# Patient Record
Sex: Female | Born: 1970
Health system: Southern US, Community
[De-identification: ages and names within clinical notes are randomized; demographics above are authoritative.]

## PROBLEM LIST (undated history)

## (undated) DIAGNOSIS — T7840XA Allergy, unspecified, initial encounter: Secondary | ICD-10-CM

## (undated) DIAGNOSIS — Z9049 Acquired absence of other specified parts of digestive tract: Secondary | ICD-10-CM

## (undated) DIAGNOSIS — E785 Hyperlipidemia, unspecified: Secondary | ICD-10-CM

## (undated) DIAGNOSIS — K219 Gastro-esophageal reflux disease without esophagitis: Secondary | ICD-10-CM

## (undated) DIAGNOSIS — F32A Depression, unspecified: Secondary | ICD-10-CM

## (undated) DIAGNOSIS — I10 Essential (primary) hypertension: Secondary | ICD-10-CM

## (undated) DIAGNOSIS — F419 Anxiety disorder, unspecified: Secondary | ICD-10-CM

## (undated) DIAGNOSIS — M1990 Unspecified osteoarthritis, unspecified site: Secondary | ICD-10-CM

## (undated) DIAGNOSIS — K589 Irritable bowel syndrome without diarrhea: Secondary | ICD-10-CM

## (undated) DIAGNOSIS — D649 Anemia, unspecified: Secondary | ICD-10-CM

## (undated) DIAGNOSIS — F329 Major depressive disorder, single episode, unspecified: Secondary | ICD-10-CM

## (undated) HISTORY — DX: Essential (primary) hypertension: I10

## (undated) HISTORY — PX: APPENDECTOMY: SHX54

## (undated) HISTORY — DX: Anemia, unspecified: D64.9

## (undated) HISTORY — PX: COLONOSCOPY: SHX174

## (undated) HISTORY — PX: FRACTURE SURGERY: SHX138

## (undated) HISTORY — DX: Anxiety disorder, unspecified: F41.9

## (undated) HISTORY — DX: Allergy, unspecified, initial encounter: T78.40XA

## (undated) HISTORY — DX: Hyperlipidemia, unspecified: E78.5

## (undated) HISTORY — DX: Unspecified osteoarthritis, unspecified site: M19.90

## (undated) HISTORY — DX: Depression, unspecified: F32.A

## (undated) HISTORY — DX: Major depressive disorder, single episode, unspecified: F32.9

## (undated) HISTORY — DX: Irritable bowel syndrome, unspecified: K58.9

## (undated) HISTORY — DX: Gastro-esophageal reflux disease without esophagitis: K21.9

---

## 1998-07-10 HISTORY — PX: FRACTURE SURGERY: SHX138

## 2000-02-26 ENCOUNTER — Inpatient Hospital Stay (HOSPITAL_COMMUNITY): Admission: EM | Admit: 2000-02-26 | Discharge: 2000-02-29 | Payer: Self-pay | Admitting: *Deleted

## 2001-01-28 ENCOUNTER — Encounter: Payer: Self-pay | Admitting: Oral Surgery

## 2001-01-29 ENCOUNTER — Ambulatory Visit (HOSPITAL_COMMUNITY): Admission: RE | Admit: 2001-01-29 | Discharge: 2001-01-29 | Payer: Self-pay | Admitting: Oral Surgery

## 2004-05-17 ENCOUNTER — Emergency Department (HOSPITAL_COMMUNITY): Admission: EM | Admit: 2004-05-17 | Discharge: 2004-05-17 | Payer: Self-pay | Admitting: Emergency Medicine

## 2004-05-20 ENCOUNTER — Ambulatory Visit: Payer: Self-pay | Admitting: Family Medicine

## 2005-01-17 ENCOUNTER — Ambulatory Visit: Payer: Self-pay | Admitting: Family Medicine

## 2005-10-23 ENCOUNTER — Ambulatory Visit: Payer: Self-pay | Admitting: Family Medicine

## 2006-01-25 ENCOUNTER — Ambulatory Visit: Payer: Self-pay | Admitting: Family Medicine

## 2006-10-17 ENCOUNTER — Ambulatory Visit: Payer: Self-pay | Admitting: Family Medicine

## 2006-11-19 ENCOUNTER — Ambulatory Visit: Payer: Self-pay | Admitting: Family Medicine

## 2007-01-21 ENCOUNTER — Ambulatory Visit: Payer: Self-pay | Admitting: Family Medicine

## 2007-02-05 ENCOUNTER — Ambulatory Visit: Payer: Self-pay | Admitting: Family Medicine

## 2007-02-05 DIAGNOSIS — J029 Acute pharyngitis, unspecified: Secondary | ICD-10-CM | POA: Insufficient documentation

## 2007-02-05 DIAGNOSIS — F329 Major depressive disorder, single episode, unspecified: Secondary | ICD-10-CM

## 2007-02-05 DIAGNOSIS — K589 Irritable bowel syndrome without diarrhea: Secondary | ICD-10-CM | POA: Insufficient documentation

## 2007-02-05 DIAGNOSIS — J309 Allergic rhinitis, unspecified: Secondary | ICD-10-CM | POA: Insufficient documentation

## 2007-02-05 LAB — CONVERTED CEMR LAB: Rapid Strep: POSITIVE

## 2007-02-25 ENCOUNTER — Ambulatory Visit: Payer: Self-pay | Admitting: Family Medicine

## 2007-02-25 ENCOUNTER — Telehealth: Payer: Self-pay | Admitting: Family Medicine

## 2007-02-25 LAB — CONVERTED CEMR LAB: Rapid Strep: POSITIVE

## 2008-05-25 ENCOUNTER — Emergency Department (HOSPITAL_COMMUNITY): Admission: EM | Admit: 2008-05-25 | Discharge: 2008-05-25 | Payer: Self-pay | Admitting: Emergency Medicine

## 2008-05-30 ENCOUNTER — Emergency Department (HOSPITAL_COMMUNITY): Admission: EM | Admit: 2008-05-30 | Discharge: 2008-05-30 | Payer: Self-pay | Admitting: Emergency Medicine

## 2009-05-04 ENCOUNTER — Ambulatory Visit (HOSPITAL_COMMUNITY): Admission: RE | Admit: 2009-05-04 | Discharge: 2009-05-04 | Payer: Self-pay | Admitting: Unknown Physician Specialty

## 2009-10-14 ENCOUNTER — Ambulatory Visit: Payer: Self-pay | Admitting: Family Medicine

## 2009-10-14 DIAGNOSIS — I10 Essential (primary) hypertension: Secondary | ICD-10-CM | POA: Insufficient documentation

## 2009-10-14 DIAGNOSIS — M479 Spondylosis, unspecified: Secondary | ICD-10-CM | POA: Insufficient documentation

## 2009-10-15 ENCOUNTER — Telehealth: Payer: Self-pay | Admitting: Family Medicine

## 2009-10-18 ENCOUNTER — Ambulatory Visit: Payer: Self-pay | Admitting: Family Medicine

## 2009-10-20 LAB — CONVERTED CEMR LAB
ALT: 14 units/L (ref 0–35)
AST: 19 units/L (ref 0–37)
Albumin: 3.8 g/dL (ref 3.5–5.2)
Alkaline Phosphatase: 59 units/L (ref 39–117)
BUN: 7 mg/dL (ref 6–23)
Basophils Absolute: 0 10*3/uL (ref 0.0–0.1)
Basophils Relative: 0.5 % (ref 0.0–3.0)
Bilirubin, Direct: 0.1 mg/dL (ref 0.0–0.3)
CO2: 29 meq/L (ref 19–32)
Calcium: 8.7 mg/dL (ref 8.4–10.5)
Chloride: 105 meq/L (ref 96–112)
Cholesterol: 165 mg/dL (ref 0–200)
Creatinine, Ser: 0.6 mg/dL (ref 0.4–1.2)
Eosinophils Absolute: 0.1 10*3/uL (ref 0.0–0.7)
Eosinophils Relative: 2.8 % (ref 0.0–5.0)
GFR calc non Af Amer: 141.84 mL/min (ref >60)
Glucose, Bld: 85 mg/dL (ref 70–99)
HCT: 36.3 % (ref 36.0–46.0)
HDL: 40.6 mg/dL (ref >39.00)
Hemoglobin: 12.1 g/dL (ref 12.0–15.0)
LDL Cholesterol: 107 mg/dL — ABNORMAL HIGH (ref 0–99)
Lymphocytes Relative: 42.4 % (ref 12.0–46.0)
Lymphs Abs: 2.3 10*3/uL (ref 0.7–4.0)
MCHC: 33.4 g/dL (ref 30.0–36.0)
MCV: 85 fL (ref 78.0–100.0)
Monocytes Absolute: 0.5 10*3/uL (ref 0.1–1.0)
Monocytes Relative: 9.8 % (ref 3.0–12.0)
Neutro Abs: 2.4 10*3/uL (ref 1.4–7.7)
Neutrophils Relative %: 44.5 % (ref 43.0–77.0)
Platelets: 228 10*3/uL (ref 150.0–400.0)
Potassium: 3.1 meq/L — ABNORMAL LOW (ref 3.5–5.1)
RBC: 4.27 M/uL (ref 3.87–5.11)
RDW: 14.1 % (ref 11.5–14.6)
Sodium: 142 meq/L (ref 135–145)
TSH: 1.06 microintl units/mL (ref 0.35–5.50)
Total Bilirubin: 0.5 mg/dL (ref 0.3–1.2)
Total CHOL/HDL Ratio: 4
Total Protein: 8 g/dL (ref 6.0–8.3)
Triglycerides: 87 mg/dL (ref 0.0–149.0)
VLDL: 17.4 mg/dL (ref 0.0–40.0)
WBC: 5.4 10*3/uL (ref 4.5–10.5)

## 2009-11-01 ENCOUNTER — Ambulatory Visit: Payer: Self-pay | Admitting: Family Medicine

## 2009-11-01 DIAGNOSIS — R5383 Other fatigue: Secondary | ICD-10-CM

## 2009-11-01 DIAGNOSIS — R5381 Other malaise: Secondary | ICD-10-CM | POA: Insufficient documentation

## 2009-11-03 LAB — CONVERTED CEMR LAB
Vit D, 25-Hydroxy: 14 ng/mL — ABNORMAL LOW (ref 30–89)
Vitamin B-12: 374 pg/mL (ref 211–911)

## 2010-02-28 ENCOUNTER — Ambulatory Visit: Payer: Self-pay | Admitting: Family Medicine

## 2010-05-06 ENCOUNTER — Ambulatory Visit (HOSPITAL_COMMUNITY): Admission: RE | Admit: 2010-05-06 | Discharge: 2010-05-06 | Payer: Self-pay | Admitting: Unknown Physician Specialty

## 2010-05-06 ENCOUNTER — Ambulatory Visit: Payer: Self-pay | Admitting: Family Medicine

## 2010-05-06 DIAGNOSIS — E559 Vitamin D deficiency, unspecified: Secondary | ICD-10-CM | POA: Insufficient documentation

## 2010-05-06 LAB — CONVERTED CEMR LAB
BUN: 12 mg/dL (ref 6–23)
CO2: 30 meq/L (ref 19–32)
Calcium: 9.2 mg/dL (ref 8.4–10.5)
Chloride: 105 meq/L (ref 96–112)
Creatinine, Ser: 0.7 mg/dL (ref 0.4–1.2)
GFR calc non Af Amer: 111.05 mL/min (ref >60)
Glucose, Bld: 83 mg/dL (ref 70–99)
Potassium: 4 meq/L (ref 3.5–5.1)
Sodium: 142 meq/L (ref 135–145)

## 2010-05-09 ENCOUNTER — Telehealth: Payer: Self-pay | Admitting: Family Medicine

## 2010-05-09 LAB — CONVERTED CEMR LAB: Vit D, 25-Hydroxy: 51 ng/mL (ref 30–89)

## 2010-05-10 ENCOUNTER — Telehealth: Payer: Self-pay | Admitting: Family Medicine

## 2010-06-08 ENCOUNTER — Ambulatory Visit: Payer: Self-pay | Admitting: Family Medicine

## 2010-06-08 DIAGNOSIS — G43909 Migraine, unspecified, not intractable, without status migrainosus: Secondary | ICD-10-CM | POA: Insufficient documentation

## 2010-08-09 NOTE — Assessment & Plan Note (Signed)
Summary: STREP THROAT/PS   Vital Signs:  Patient Profile:   40 Years Old Female Weight:      184 pounds (83.64 kg) Temp:     98.3 degrees F (36.83 degrees C) oral Pulse rate:   76 / minute Pulse rhythm:   regular BP sitting:   140 / 88  (left arm)  Vitals Entered By: Alfred Levins, CMA (February 25, 2007 11:50 AM)   Chief Complaint:  st.  History of Present Illness: 3 days of ST and fevers. Finished 10 days of Omnicef for positive strep throat on 02-15-07 after being seen here on 02-04-07. Felt fine until 3 days ago. No other sick household members. Her son is 24 years old, has not been sick to her knowledge.   Current Allergies: ! CIPRO      Physical Exam  General:     Well-developed,well-nourished,in no acute distress; alert,appropriate and cooperative throughout examination Mouth:     pharyngeal erythema.      Impression & Recommendations:  Problem # 1:  SORE THROAT (ICD-462)  Her updated medication list for this problem includes:    Omnicef 300 Mg Caps (Cefdinir) .Marland Kitchen... 2 once daily    Omnicef 300 Mg Caps (Cefdinir) .Marland Kitchen... 2 a day  Orders: Rapid Strep (16109)   Complete Medication List: 1)  Maxzide 75-50 Mg Tabs (Triamterene-hctz) .Marland Kitchen.. 1 by mouth as needed 2)  Zyrtec Allergy 10 Mg Tabs (Cetirizine hcl) .Marland Kitchen.. 1 by mouth as needed 3)  Flonase 50 Mcg/act Susp (Fluticasone propionate) .... 2 srays each nostril as needed 4)  Ambien 10 Mg Tabs (Zolpidem tartrate) .Marland Kitchen.. 1 by mouth at bedtime as needed 5)  Lasix 20 Mg Tabs (Furosemide) .Marland Kitchen.. 1 by mouth as needed 6)  Omnicef 300 Mg Caps (Cefdinir) .... 2 once daily 7)  Omnicef 300 Mg Caps (Cefdinir) .... 2 a day   Patient Instructions: 1)  Please schedule a follow-up appointment as needed.    Prescriptions: OMNICEF 300 MG  CAPS (CEFDINIR) 2 a day  #20 x 0   Entered and Authorized by:   Nelwyn Salisbury MD   Signed by:   Nelwyn Salisbury MD on 02/25/2007   Method used:   Electronically sent to ...       [facility]  [phone]*       [address]       [place], [place]  [address]       Ph: [phone] or [phone]       Fax: [phone]   RxID:   [id number]       Laboratory Results   Date/Time Reported: February 25, 2007 11:21 AM   Other Tests  Rapid Strep: positive Comments ..................................................................Marland KitchenWynona Canes, CMA  February 25, 2007 11:22 AM     weak positive strep

## 2010-08-09 NOTE — Assessment & Plan Note (Signed)
Summary: med check/cjr/PT RESCD//CCM   Vital Signs:  Patient profile:   40 year old female Weight:      166 pounds O2 Sat:      95 % Temp:     98.5 degrees F Pulse rate:   72 / minute BP sitting:   124 / 80  (left arm)  Vitals Entered By: Pura Spice, RN (June 08, 2010 1:18 PM) CC: discuss meds  rx [facility]    History of Present Illness: Here for several reasons. First her insurance company is forcing her to use a 90 day mail off plan, so we need to switch her prescriptions over to this. Also she has been getting more migraines lately, averaging about one a week. OTC meds had been helping but now they do not help at all. These are typical migraines with nausea and light sensitivity. She had tried Imitrex a few years ago, but she could not tolerate this due to severe sensations of her skin crawling all over her body. She had excellent results with Maxalt.   Allergies: 1)  ! Cipro  Past History:  Past Medical History: Allergic rhinitis Depression Hypertension sees Dr. Veto Kemps in [facility] for GYN exams vitamin D deficiency migraine HAs  Review of Systems  The patient denies anorexia, fever, weight loss, weight gain, vision loss, decreased hearing, hoarseness, chest pain, syncope, dyspnea on exertion, peripheral edema, prolonged cough, hemoptysis, abdominal pain, melena, hematochezia, severe indigestion/heartburn, hematuria, incontinence, genital sores, muscle weakness, suspicious skin lesions, transient blindness, difficulty walking, depression, unusual weight change, abnormal bleeding, enlarged lymph nodes, angioedema, breast masses, and testicular masses.    Physical Exam  General:  Well-developed,well-nourished,in no acute distress; alert,appropriate and cooperative throughout examination Neck:  No deformities, masses, or tenderness noted. Lungs:  Normal respiratory effort, chest expands symmetrically. Lungs are clear to auscultation, no crackles or  wheezes. Heart:  Normal rate and regular rhythm. S1 and S2 normal without gallop, murmur, click, rub or other extra sounds. Neurologic:  alert & oriented X3, cranial nerves II-XII intact, strength normal in all extremities, and gait normal.     Impression & Recommendations:  Problem # 1:  MIGRAINE HEADACHE (ICD-346.90)  Her updated medication list for this problem includes:    Maxalt 10 Mg Tabs (Rizatriptan benzoate) .Marland Kitchen... As needed for has  Problem # 2:  HYPERTENSION (ICD-401.9)  Her updated medication list for this problem includes:    Furosemide 40 Mg Tabs (Furosemide) ..... Once daily  Complete Medication List: 1)  Klor-con M20 20 Meq Cr-tabs (Potassium chloride crys cr) .... Once daily 2)  Furosemide 40 Mg Tabs (Furosemide) .... Once daily 3)  Vitamin D3 3000 Unit Tabs (Cholecalciferol) .Marland Kitchen.. 1  by mouth once daily 4)  Maxalt 10 Mg Tabs (Rizatriptan benzoate) .... As needed for has  Patient Instructions: 1)  She will get back on Maxalt. We changed her rx's over to the 90 day plan.  Prescriptions: MAXALT 10 MG TABS (RIZATRIPTAN BENZOATE) as needed for HAs  #36 x 3   Entered and Authorized by:   Nelwyn Salisbury MD   Signed by:   Nelwyn Salisbury MD on 06/08/2010   Method used:   Print then Give to Patient   RxID:   [id number] MAXALT 10 MG TABS (RIZATRIPTAN BENZOATE) as needed for HAs  #12 x 0   Entered and Authorized by:   Nelwyn Salisbury MD   Signed by:   Nelwyn Salisbury MD on 06/08/2010   Method used:  Print then Give to Patient   RxID:   [id number] KLOR-CON M20 20 MEQ CR-TABS (POTASSIUM CHLORIDE CRYS CR) once daily  #90 x 3   Entered and Authorized by:   Nelwyn Salisbury MD   Signed by:   Nelwyn Salisbury MD on 06/08/2010   Method used:   Print then Give to Patient   RxID:   [id number]    Orders Added: 1)  Est. Patient Level IV [29528]

## 2010-08-09 NOTE — Assessment & Plan Note (Signed)
Summary: fu on bp/njr/pt rsc/cjr   Vital Signs:  Patient profile:   40 year old female Weight:      194 pounds Temp:     98.9 degrees F oral BP sitting:   162 / 118  (left arm) Cuff size:   regular  Vitals Entered By: Raechel Ache, RN (October 18, 2009 10:53 AM) CC: BP still high.   History of Present Illness: Here to follow up on HTN. We had not seen her here in several years, and she had stopped all her meds. Then last week her BP was found to be high at her dentist's office, and she was seen here with a BP of  168/110. Other than having some mild HAs, she has felt fine. No chest pain or SOB. Taking Lisinopril now.   Allergies: 1)  ! Cipro  Past History:  Past Medical History: Allergic rhinitis Depression Hypertension  Review of Systems  The patient denies anorexia, fever, weight loss, weight gain, vision loss, decreased hearing, hoarseness, chest pain, syncope, dyspnea on exertion, peripheral edema, prolonged cough, hemoptysis, abdominal pain, melena, hematochezia, severe indigestion/heartburn, hematuria, incontinence, genital sores, muscle weakness, suspicious skin lesions, transient blindness, difficulty walking, depression, unusual weight change, abnormal bleeding, enlarged lymph nodes, angioedema, breast masses, and testicular masses.    Physical Exam  General:  Well-developed,well-nourished,in no acute distress; alert,appropriate and cooperative throughout examination Head:  Normocephalic and atraumatic without obvious abnormalities. No apparent alopecia or balding. Eyes:  No corneal or conjunctival inflammation noted. EOMI. Perrla. Funduscopic exam benign, without hemorrhages, exudates or papilledema. Vision grossly normal. Ears:  External ear exam shows no significant lesions or deformities.  Otoscopic examination reveals clear canals, tympanic membranes are intact bilaterally without bulging, retraction, inflammation or discharge. Hearing is grossly normal  bilaterally. Nose:  External nasal examination shows no deformity or inflammation. Nasal mucosa are pink and moist without lesions or exudates. Mouth:  Oral mucosa and oropharynx without lesions or exudates.  Teeth in good repair. Neck:  No deformities, masses, or tenderness noted. Lungs:  Normal respiratory effort, chest expands symmetrically. Lungs are clear to auscultation, no crackles or wheezes. Heart:  Normal rate and regular rhythm. S1 and S2 normal without gallop, murmur, click, rub or other extra sounds. Extremities:  trace left pedal edema and trace right pedal edema.     Impression & Recommendations:  Problem # 1:  HYPERTENSION (ICD-401.9)  The following medications were removed from the medication list:    Maxzide 75-50 Mg Tabs (Triamterene-hctz) .Marland Kitchen... 1 by mouth as needed    Lasix 20 Mg Tabs (Furosemide) .Marland Kitchen... 1 by mouth as needed    Lisinopril 20 Mg Tabs (Lisinopril) .Marland Kitchen... 1 once daily for blood pressure Her updated medication list for this problem includes:    Azor 5-40 Mg Tabs (Amlodipine-olmesartan) ..... Once daily    Furosemide 20 Mg Tabs (Furosemide) ..... Once daily  Orders: UA Dipstick w/o Micro (automated)  (81003) Venipuncture (82423) TLB-Lipid Panel (80061-LIPID) TLB-BMP (Basic Metabolic Panel-BMET) (80048-METABOL) TLB-CBC Platelet - w/Differential (85025-CBCD) TLB-Hepatic/Liver Function Pnl (80076-HEPATIC) TLB-TSH (Thyroid Stimulating Hormone) (84443-TSH)  Complete Medication List: 1)  Azor 5-40 Mg Tabs (Amlodipine-olmesartan) .... Once daily 2)  Furosemide 20 Mg Tabs (Furosemide) .... Once daily  Patient Instructions: 1)  Get labs. Switch to Azor and Lasix.  2)  Please schedule a follow-up appointment in 2 weeks.  Prescriptions: FUROSEMIDE 20 MG TABS (FUROSEMIDE) once daily  #30 x 11   Entered and Authorized by:   Nelwyn Salisbury MD  Signed by:   Nelwyn Salisbury MD on 10/18/2009   Method used:   Electronically to        [facility]   [phone]* (retail)       [address]       [place], [place]  [address]       Ph: [phone] or [phone]       Fax: [phone]   RxID:   [id number]   Appended Document: fu on bp/njr/pt rsc/cjr  Laboratory Results   Urine Tests    Routine Urinalysis   Color: yellow Appearance: Clear Glucose: negative   (Normal Range: Negative) Bilirubin: negative   (Normal Range: Negative) Ketone: negative   (Normal Range: Negative) Spec. Gravity: 1.020   (Normal Range: 1.003-1.035) Blood: negative   (Normal Range: Negative) pH: 5.5   (Normal Range: 5.0-8.0) Protein: negative   (Normal Range: Negative) Urobilinogen: 0.2   (Normal Range: 0-1) Nitrite: negative   (Normal Range: Negative) Leukocyte Esterace: negative   (Normal Range: Negative)    Comments: Rita Ohara  October 18, 2009 12:42 PM

## 2010-08-09 NOTE — Progress Notes (Signed)
Summary: ?cont vit d   Phone Note Call from Patient   Caller: Patient [phone] Summary of Call: wants to know if could cont vit d crx or use OTC vit d  Initial call taken by: Pura Spice, RN,  May 09, 2010 12:39 PM  Follow-up for Phone Call        use OTC vitamin D but take 2000 units a day Follow-up by: Nelwyn Salisbury MD,  May 09, 2010 4:46 PM  Additional Follow-up for Phone Call Additional follow up Details #1::        pt called  Additional Follow-up by: Pura Spice, RN,  May 09, 2010 4:48 PM

## 2010-08-09 NOTE — Progress Notes (Signed)
  Phone Note Call from Patient   Caller: Patient Call For: Nelwyn Salisbury MD Summary of Call: BP today was 150/96 Initial call taken by: Lynann Beaver CMA,  October 15, 2009 1:16 PM

## 2010-08-09 NOTE — Assessment & Plan Note (Signed)
Summary: MEDICATION CONCERNS // RS   Vital Signs:  Patient profile:   40 year old female Weight:      175 pounds Temp:     98.2 degrees F oral BP sitting:   138 / 94  (left arm) Cuff size:   regular  Vitals Entered By: Raechel Ache, RN (February 28, 2010 8:51 AM) CC: Talk about meds and weight.   History of Present Illness: Here to follow up on HTN and she has some questions about mood. She is working with [employer] and exercising, and she has lost 15 lbs in the past few months. Her BP at home is steady in the 120s over 70s, and she feels good. She has stopped her Amlodipine and Losartan, and she asks if this is okay. Also she has felt a little blue or sad at times, but she denies any real depression. She asks if there is an OTC product she could try.   Allergies: 1)  ! Cipro  Past History:  Past Medical History: Reviewed history from 11/01/2009 and no changes required. Allergic rhinitis Depression Hypertension sees Dr. Veto Kemps in [facility] for GYN exams  Review of Systems  The patient denies anorexia, fever, weight gain, vision loss, decreased hearing, hoarseness, chest pain, syncope, dyspnea on exertion, peripheral edema, prolonged cough, headaches, hemoptysis, abdominal pain, melena, hematochezia, severe indigestion/heartburn, hematuria, incontinence, genital sores, muscle weakness, suspicious skin lesions, transient blindness, difficulty walking, unusual weight change, abnormal bleeding, enlarged lymph nodes, angioedema, breast masses, and testicular masses.    Physical Exam  General:  Well-developed,well-nourished,in no acute distress; alert,appropriate and cooperative throughout examination Neck:  No deformities, masses, or tenderness noted. Lungs:  Normal respiratory effort, chest expands symmetrically. Lungs are clear to auscultation, no crackles or wheezes. Heart:  Normal rate and regular rhythm. S1 and S2 normal without gallop, murmur, click, rub or  other extra sounds. Psych:  Cognition and judgment appear intact. Alert and cooperative with normal attention span and concentration. No apparent delusions, illusions, hallucinations   Impression & Recommendations:  Problem # 1:  HYPERTENSION (ICD-401.9)  The following medications were removed from the medication list:    Losartan Potassium 100 Mg Tabs (Losartan potassium) ..... Once daily    Amlodipine Besylate 5 Mg Tabs (Amlodipine besylate) ..... Once daily Her updated medication list for this problem includes:    Furosemide 20 Mg Tabs (Furosemide) ..... Once daily  Problem # 2:  DEPRESSION (ICD-311)  Complete Medication List: 1)  Furosemide 20 Mg Tabs (Furosemide) .... Once daily 2)  Klor-con M20 20 Meq Cr-tabs (Potassium chloride crys cr) .Marland Kitchen.. 1 once daily 3)  Vitamin D (ergocalciferol) 50000 Unit Caps (Ergocalciferol) .Marland Kitchen.. 1 q week  Patient Instructions: 1)  She seems to be doing well off these 2 meds, so we agreed to stay off them. Suggested she try St. John's Wort for her moods.

## 2010-08-09 NOTE — Progress Notes (Signed)
Summary: requesting new rx  Phone Note Call from Patient Call back at [phone]   Caller: Patient---triage vm Summary of Call: on Furosemide. would like to increase to 40mg . she took 2 at a tome and felt better. please send new rx to [facility] Initial call taken by: Warnell Forester,  May 10, 2010 4:49 PM  Follow-up for Phone Call        okay. Increase Lasix to 40 mg a day. Follow-up by: Nelwyn Salisbury MD,  May 10, 2010 5:34 PM  Additional Follow-up for Phone Call Additional follow up Details #1::        left mess on pt vm that  rx faxed to [facility] Additional Follow-up by: Pura Spice, RN,  May 11, 2010 9:09 AM    New/Updated Medications: FUROSEMIDE 40 MG TABS (FUROSEMIDE) once daily Prescriptions: FUROSEMIDE 40 MG TABS (FUROSEMIDE) once daily  #90 x 3   Entered and Authorized by:   Nelwyn Salisbury MD   Signed by:   Nelwyn Salisbury MD on 05/10/2010   Method used:   Print then Give to Patient   RxID:   [id number]

## 2010-08-09 NOTE — Assessment & Plan Note (Signed)
Summary: follow up/pt coming in fasting/cjr   Vital Signs:  Patient profile:   40 year old female Weight:      167 pounds O2 Sat:      98 % Temp:     98.9 degrees F Pulse rate:   100 / minute BP sitting:   120 / 84  Vitals Entered By: Pura Spice, RN (May 06, 2010 8:40 AM) CC: 6 month follow up  on k+and vit d. fastng    History of Present Illness: Here to follow up on HTN, depression, and low vitamin D. She feels great today, and she has lost 8 lbs over the past 2 months. Her BP is stable. Her moods are much improved.   Allergies: 1)  ! Cipro  Past History:  Past Medical History: Allergic rhinitis Depression Hypertension sees Dr. Veto Kemps in [facility] for GYN exams vitamin D deficiency  Review of Systems  The patient denies anorexia, fever, weight loss, weight gain, vision loss, decreased hearing, hoarseness, chest pain, syncope, dyspnea on exertion, peripheral edema, prolonged cough, headaches, hemoptysis, abdominal pain, melena, hematochezia, severe indigestion/heartburn, hematuria, incontinence, genital sores, muscle weakness, suspicious skin lesions, transient blindness, difficulty walking, depression, unusual weight change, abnormal bleeding, enlarged lymph nodes, angioedema, breast masses, and testicular masses.    Physical Exam  General:  Well-developed,well-nourished,in no acute distress; alert,appropriate and cooperative throughout examination Neck:  No deformities, masses, or tenderness noted. Lungs:  Normal respiratory effort, chest expands symmetrically. Lungs are clear to auscultation, no crackles or wheezes. Heart:  Normal rate and regular rhythm. S1 and S2 normal without gallop, murmur, click, rub or other extra sounds. Psych:  Cognition and judgment appear intact. Alert and cooperative with normal attention span and concentration. No apparent delusions, illusions, hallucinations   Impression & Recommendations:  Problem # 1:   HYPERTENSION (ICD-401.9)  Her updated medication list for this problem includes:    Furosemide 20 Mg Tabs (Furosemide) ..... Once daily  Orders: Venipuncture (95621) TLB-BMP (Basic Metabolic Panel-BMET) (80048-METABOL)  Problem # 2:  DEPRESSION (ICD-311)  Problem # 3:  VITAMIN D DEFICIENCY (ICD-268.9)  Orders: T-Vitamin D (25-Hydroxy) (30865-78469)  Complete Medication List: 1)  Furosemide 20 Mg Tabs (Furosemide) .... Once daily 2)  Klor-con M20 20 Meq Cr-tabs (Potassium chloride crys cr) .Marland Kitchen.. 1 once daily 3)  Vitamin D (ergocalciferol) 50000 Unit Caps (Ergocalciferol) .Marland Kitchen.. 1 q week  Patient Instructions: 1)  Get labs.   Orders Added: 1)  Est. Patient Level IV [62952] 2)  Venipuncture [84132] 3)  TLB-BMP (Basic Metabolic Panel-BMET) [80048-METABOL] 4)  T-Vitamin D (25-Hydroxy) [44010-27253]  Appended Document: Orders Update    Clinical Lists Changes  Orders: Added new Service order of Specimen Handling (66440) - Signed

## 2010-08-09 NOTE — Assessment & Plan Note (Signed)
Summary: 2 WK ROV/NJR   Vital Signs:  Patient profile:   40 year old female Weight:      190 pounds BP sitting:   110 / 84  (left arm) Cuff size:   large  Vitals Entered By: Raechel Ache, RN (November 01, 2009 9:49 AM) CC: 2 week f/u.   History of Present Illness: Here to follow up on HTN. For the past 2 weeks she has been on Azor and Lasix, and she feels better. the HAs are gone, and her BP is stable. She has had some fatigue for the past year and asks if we can check her vitamin D and B12 levels.   Allergies: 1)  ! Cipro  Past History:  Past Medical History: Allergic rhinitis Depression Hypertension sees Dr. Veto Kemps in [facility] for GYN exams  Past Surgical History: Reviewed history from 02/05/2007 and no changes required. Appendectomy Repair fractures the rt lower leg screws & plate in place  Review of Systems  The patient denies anorexia, fever, weight loss, weight gain, vision loss, decreased hearing, hoarseness, chest pain, syncope, dyspnea on exertion, peripheral edema, prolonged cough, headaches, hemoptysis, abdominal pain, melena, hematochezia, severe indigestion/heartburn, hematuria, incontinence, genital sores, muscle weakness, suspicious skin lesions, transient blindness, difficulty walking, depression, unusual weight change, abnormal bleeding, enlarged lymph nodes, angioedema, breast masses, and testicular masses.    Physical Exam  General:  Well-developed,well-nourished,in no acute distress; alert,appropriate and cooperative throughout examination Neck:  No deformities, masses, or tenderness noted. Lungs:  Normal respiratory effort, chest expands symmetrically. Lungs are clear to auscultation, no crackles or wheezes. Heart:  Normal rate and regular rhythm. S1 and S2 normal without gallop, murmur, click, rub or other extra sounds. Pulses:  R and L carotid,radial,femoral,dorsalis pedis and posterior tibial pulses are full and equal  bilaterally Extremities:  No clubbing, cyanosis, edema, or deformity noted with normal full range of motion of all joints.   Neurologic:  alert & oriented X3, cranial nerves II-XII intact, and gait normal.     Impression & Recommendations:  Problem # 1:  HYPERTENSION (ICD-401.9)  The following medications were removed from the medication list:    Azor 5-40 Mg Tabs (Amlodipine-olmesartan) ..... Once daily Her updated medication list for this problem includes:    Furosemide 20 Mg Tabs (Furosemide) ..... Once daily    Losartan Potassium 100 Mg Tabs (Losartan potassium) ..... Once daily    Amlodipine Besylate 5 Mg Tabs (Amlodipine besylate) ..... Once daily  Problem # 2:  WEAKNESS (ICD-780.79)  Orders: Venipuncture (44034) TLB-B12, Serum-Total ONLY (74259-D63) T-Vitamin D (25-Hydroxy) (87564-33295)  Complete Medication List: 1)  Furosemide 20 Mg Tabs (Furosemide) .... Once daily 2)  Klor-con M20 20 Meq Cr-tabs (Potassium chloride crys cr) .Marland Kitchen.. 1 once daily 3)  Losartan Potassium 100 Mg Tabs (Losartan potassium) .... Once daily 4)  Amlodipine Besylate 5 Mg Tabs (Amlodipine besylate) .... Once daily  Patient Instructions: 1)  check labs. we will rearrange her meds to be all generics per her request.  Prescriptions: KLOR-CON M20 20 MEQ CR-TABS (POTASSIUM CHLORIDE CRYS CR) 1 once daily  #90 x 3   Entered and Authorized by:   Nelwyn Salisbury MD   Signed by:   Nelwyn Salisbury MD on 11/01/2009   Method used:   Print then Give to Patient   RxID:   [id number] FUROSEMIDE 20 MG TABS (FUROSEMIDE) once daily  #90 x 3   Entered and Authorized by:   Nelwyn Salisbury MD   Signed  by:   Nelwyn Salisbury MD on 11/01/2009   Method used:   Print then Give to Patient   RxID:   [id number] AMLODIPINE BESYLATE 5 MG TABS (AMLODIPINE BESYLATE) once daily  #90 x 3   Entered and Authorized by:   Nelwyn Salisbury MD   Signed by:   Nelwyn Salisbury MD on 11/01/2009   Method used:   Print then Give to Patient    RxID:   [id number] UUVOZDGU POTASSIUM 100 MG TABS (LOSARTAN POTASSIUM) once daily  #90 x 3   Entered and Authorized by:   Nelwyn Salisbury MD   Signed by:   Nelwyn Salisbury MD on 11/01/2009   Method used:   Print then Give to Patient   RxID:   [id number]

## 2010-08-09 NOTE — Assessment & Plan Note (Signed)
Summary: SORE THROAT CONTINUES P AMOX   Vital Signs:  Patient Profile:   40 Years Old Female Weight:      183 pounds (83.18 kg) Temp:     99.6 degrees F (37.56 degrees C) oral Pulse rate:   80 / minute Pulse rhythm:   regular BP sitting:   142 / 102  (left arm)  Vitals Entered By: Alfred Levins, CMA (February 05, 2007 5:15 PM)               Chief Complaint:  st x 2 days.  History of Present Illness: Here on 01-21-07 for positive strep throat. Given 10 days of amoxicillin. Felt better but as soon as meds ran out her sx returned. Now has ST and fever again. o cough.  Current Allergies: ! CIPRO  Past Medical History:    Reviewed history and no changes required:       Allergic rhinitis       Depression  Past Surgical History:    Appendectomy    Repair fractures the rt lower leg screws & plate in place     Review of Systems      See HPI   Physical Exam  General:     Well-developed,well-nourished,in no acute distress; alert,appropriate and cooperative throughout examination Head:     Normocephalic and atraumatic without obvious abnormalities. No apparent alopecia or balding. Eyes:     No corneal or conjunctival inflammation noted. EOMI. Perrla. Funduscopic exam benign, without hemorrhages, exudates or papilledema. Vision grossly normal. Ears:     External ear exam shows no significant lesions or deformities.  Otoscopic examination reveals clear canals, tympanic membranes are intact bilaterally without bulging, retraction, inflammation or discharge. Hearing is grossly normal bilaterally. Nose:     External nasal examination shows no deformity or inflammation. Nasal mucosa are pink and moist without lesions or exudates. Mouth:     tonsil hypertropied and pharyngeal erythema.   Neck:     No deformities, masses, or tenderness noted. Cervical Nodes:     ac nodes tender    Impression & Recommendations:  Problem # 1:  SORE THROAT (ICD-462)  Her updated medication list  for this problem includes:    Omnicef 300 Mg Caps (Cefdinir) .Marland Kitchen... 2 once daily  Orders: Rapid Strep (16109)   Medications Added to Medication List This Visit: 1)  Maxzide 75-50 Mg Tabs (Triamterene-hctz) .Marland Kitchen.. 1 by mouth as needed 2)  Zyrtec Allergy 10 Mg Tabs (Cetirizine hcl) .Marland Kitchen.. 1 by mouth as needed 3)  Flonase 50 Mcg/act Susp (Fluticasone propionate) .... 2 srays each nostril as needed 4)  Ambien 10 Mg Tabs (Zolpidem tartrate) .Marland Kitchen.. 1 by mouth at bedtime as needed 5)  Lasix 20 Mg Tabs (Furosemide) .Marland Kitchen.. 1 by mouth as needed 6)  Omnicef 300 Mg Caps (Cefdinir) .... 2 once daily   Patient Instructions: 1)  Please schedule a follow-up appointment as needed.   Prescriptions: OMNICEF 300 MG  CAPS (CEFDINIR) 2 once daily  #20 x 0   Entered and Authorized by:   Nelwyn Salisbury MD   Signed by:   Nelwyn Salisbury MD on 02/05/2007   Method used:   Electronically sent to ...       [facility]       [address] [place], [place]  [address]       Ph: [phone]       Fax: [phone]   RxID:   [id number]  Medications Added MAXZIDE 75-50 MG TABS (TRIAMTERENE-HCTZ) 1 by mouth as needed ZYRTEC ALLERGY 10 MG  TABS (CETIRIZINE HCL) 1 by mouth as needed FLONASE 50 MCG/ACT SUSP (FLUTICASONE PROPIONATE) 2 srays each nostril as needed AMBIEN 10 MG  TABS (ZOLPIDEM TARTRATE) 1 by mouth at bedtime as needed LASIX 20 MG TABS (FUROSEMIDE) 1 by mouth as needed OMNICEF 300 MG  CAPS (CEFDINIR) 2 once daily      Allergies Added: ! CIPRO   Laboratory Results    Other Tests  Rapid Strep: positive

## 2010-08-09 NOTE — Assessment & Plan Note (Signed)
Summary: migraines/high blood pressure/cjr   Vital Signs:  Patient profile:   40 year old female Weight:      193 pounds O2 Sat:      97 % Pulse rate:   102 / minute BP sitting:   168 / 110  (left arm) Cuff size:   large  Vitals Entered By: Pura Spice, RN (October 14, 2009 2:53 PM) CC: went to dentist for cleaning and bp was  up. states been out meds for at least "years".  stated yesst had tingling in rt arm    History of Present Illness: This 40 year old female employee of [employer], he went to the dentist does a.m. and her blood pressure was found to be elevated to 168/116 zero lites a sister who has had hypertension 7 years ago following pregnancy when she was toxic and then within 2-3 months blood pressure was normal she has not been on any hypertensive therapy since that time she relates she was overweight and after losing weight her blood pressure became normal. Blood pressure fluctuated in the office at 158/118 and another time 179/121.urine clonidine 0.1 mg within 30 minutes blood pressure of 138/90 Electrocardiogram revealed sinus room: Nonspecific depression no indication of any CAD  Allergies: 1)  ! Cipro  Past History:  Past Medical History: Last updated: 02/05/2007 Allergic rhinitis Depression  Past Surgical History: Last updated: 02/05/2007 Appendectomy Repair fractures the rt lower leg screws & plate in place  Review of Systems      See HPI General:  See HPI. Eyes:  Denies blurring, discharge, double vision, eye irritation, eye pain, halos, itching, light sensitivity, red eye, vision loss-1 eye, and vision loss-both eyes. ENT:  Denies decreased hearing, difficulty swallowing, ear discharge, earache, hoarseness, nasal congestion, nosebleeds, postnasal drainage, ringing in ears, sinus pressure, and sore throat. CV:  Denies bluish discoloration of lips or nails, chest pain or discomfort, difficulty breathing at night, difficulty breathing while lying down,  fainting, fatigue, leg cramps with exertion, lightheadness, near fainting, palpitations, shortness of breath with exertion, swelling of feet, swelling of hands, and weight gain; LA blood pressure. Resp:  Denies chest discomfort, chest pain with inspiration, cough, coughing up blood, excessive snoring, hypersomnolence, morning headaches, pleuritic, shortness of breath, sputum productive, and wheezing. GI:  Denies abdominal pain, bloody stools, change in bowel habits, constipation, dark tarry stools, diarrhea, excessive appetite, gas, hemorrhoids, indigestion, loss of appetite, nausea, vomiting, vomiting blood, and yellowish skin color. GU:  Denies abnormal vaginal bleeding, decreased libido, discharge, dysuria, genital sores, hematuria, incontinence, nocturia, urinary frequency, and urinary hesitancy. MS:  has been on Mobic for arthritic pain but at this time is having some numbness and tingling from the right shoulder and arm.  Physical Exam  General:  Well-developed,well-nourished,in no acute distress; alert,appropriate and cooperative throughout examination Neck:  tenderness right lateral cervical spine Lungs:  Normal respiratory effort, chest expands symmetrically. Lungs are clear to auscultation, no crackles or wheezes. Heart:  Normal rate and regular rhythm. S1 and S2 normal without gallop, murmur, click, rub or other extra sounds. Abdomen:  Bowel sounds positive,abdomen soft and non-tender without masses, organomegaly or hernias noted. Extremities:  No clubbing, cyanosis, edema, or deformity noted with normal full range of motion of all joints.     Impression & Recommendations:  Problem # 1:  HYPERTENSION (ICD-401.9) Assessment New  Her updated medication list for this problem includes:    Maxzide 75-50 Mg Tabs (Triamterene-hctz) .Marland Kitchen... 1 by mouth as needed  Lisinopril 20 Mg Tabs (Lisinopril) .Marland Kitchen... 1 once daily for blood pressure given clonidine 0.1 mg and blood pressure and 30  minutes was 138/90  Problem # 2:  ARTHRITIS, CERVICAL SPINE (ICD-721.90) Assessment: Deteriorated mobic 15 mg qd  Complete Medication List: 1)  Maxzide 75-50 Mg Tabs (Triamterene-hctz) .Marland Kitchen.. 1 by mouth as needed 2)  Zyrtec Allergy 10 Mg Tabs (Cetirizine hcl) .Marland Kitchen.. 1 by mouth as needed 3)  Flonase 50 Mcg/act Susp (Fluticasone propionate) .... 2 srays each nostril as needed 4)  Ambien 10 Mg Tabs (Zolpidem tartrate) .Marland Kitchen.. 1 by mouth at bedtime as needed 5)  Lasix 20 Mg Tabs (Furosemide) .Marland Kitchen.. 1 by mouth as needed 6)  Omnicef 300 Mg Caps (Cefdinir) .... 2 once daily 7)  Mobic 15 Mg Tabs (Meloxicam) .... Once daily as needed pain 8)  Lisinopril 20 Mg Tabs (Lisinopril) .Marland Kitchen.. 1 once daily for blood pressure  Other Orders: EKG w/ Interpretation (93000)  Patient Instructions: 1)  Elevated Blood  pressure to 168/ 11/ 2)  after taking clonidine  BP 138/90 3)  repeat clonidine .1 mg at bedtime 4)  to start lisinopril 20 mg each day for blood pressure 5)  Have nurse check BP tomorrow and call Dr. Clent Ridges if necessary 6)  Have given an extra clonidine .1 mg if neede 7)  make appt with  Dr. Clent Ridges on 15 April. call or come in earlier if needed 8)  discuss with Drl Friday numbness aand tingling from cervical spine, cont Mobic until then Prescriptions: LISINOPRIL 20 MG TABS (LISINOPRIL) 1 once daily for blood pressure  #30 x 11   Entered and Authorized by:   Judithann Sheen MD   Signed by:   Judithann Sheen MD on 10/14/2009   Method used:   Electronically to        [facility]  [phone]* (retail)       [address]       [place], [place]  [address]       Ph: [phone] or [phone]       Fax: [phone]   RxID:   [id number]

## 2010-08-19 ENCOUNTER — Other Ambulatory Visit: Payer: Self-pay | Admitting: Family Medicine

## 2010-08-19 DIAGNOSIS — F419 Anxiety disorder, unspecified: Secondary | ICD-10-CM

## 2010-08-19 MED ORDER — PAROXETINE HCL 20 MG PO TABS: 20.0000 mg | ORAL_TABLET | ORAL | Status: DC

## 2010-09-20 ENCOUNTER — Telehealth: Payer: Self-pay | Admitting: *Deleted

## 2010-09-20 ENCOUNTER — Ambulatory Visit (INDEPENDENT_AMBULATORY_CARE_PROVIDER_SITE_OTHER): Payer: BC Managed Care – PPO | Admitting: Internal Medicine

## 2010-09-20 ENCOUNTER — Ambulatory Visit (INDEPENDENT_AMBULATORY_CARE_PROVIDER_SITE_OTHER)
Admission: RE | Admit: 2010-09-20 | Discharge: 2010-09-20 | Disposition: A | Discharge status: Home / Self Care | Payer: BC Managed Care – PPO | Source: Ambulatory Visit | Attending: Internal Medicine | Admitting: Internal Medicine

## 2010-09-20 ENCOUNTER — Encounter: Payer: Self-pay | Admitting: Internal Medicine

## 2010-09-20 DIAGNOSIS — K5900 Constipation, unspecified: Secondary | ICD-10-CM

## 2010-09-20 DIAGNOSIS — K589 Irritable bowel syndrome without diarrhea: Secondary | ICD-10-CM

## 2010-09-20 MED ORDER — PROMETHAZINE HCL 25 MG PO TABS: 25.0000 mg | ORAL_TABLET | ORAL | Status: DC | PRN

## 2010-09-20 NOTE — Patient Instructions (Addendum)
Get x ray today and will contact you about results when results back Some one will contact you about referral. In the meantime take 2 capful of miralax per day  For 3 days to see if helpful and call if no help.  Call if fever or other change in status.  You may have a bowel motility problem .

## 2010-09-20 NOTE — Progress Notes (Signed)
  Subjective:    Patient ID: Beverly Rogers, female    DOB: 29-Jan-1971, 40 y.o.   MRN: [id number]  HPI  patient comes in today for acute visit for the above problem. She has been diagnosed in the past with IBS or spastic colon and has infrequent bowel movements usually every  Week or slightly more. She remembers having bowel evaluation when she was 11 and was told she had a spastic colon. Since that time she is maintaining a pattern tfor  about the last month when she is having decreased frequency of stool and no vomiting no pain but her abdomen is gurgling she is also having some nausea with this. Last week she tried Prilosec one bottle of mag citrate which does produce some watery substance and 3 ex-lax pills  And some Dulcolax. She denies any urinary symptoms her periods are normal and she denies pregnancy. Last Bm was 3 3 12  .  Has bloating and nausea now   The only medicine she's been on the last month has been Paxil for mood but no other supplements. Her last metabolic check r potassium and thyroid was it was within 6 months.( she apparently takes Lasix and potassium for fluid retention) Past Medical History  Diagnosis Date  . Allergy   . Arthritis   . Depression   . Hypertension   . IBS (irritable bowel syndrome)   . Migraine   . Vitamin D deficiency    Past Surgical History  Procedure Date  . Appendectomy   . Fracture surgery     repair in the rt lower leg screwsand plate in place    reports that she has never smoked. She does not have any smokeless tobacco history on file. She reports that she does not drink alcohol or use illicit drugs. family history is not on file. Allergies  Allergen Reactions  . Ciprofloxacin      Review of Systems  no fever or vomiting UTI symptoms unusual bleeding she has lost 40 pounds over the recent past with Weight Watchers eating healthy no unusual supplements. She sees a gynecologist in [facility] for her GYN exams.    Objective:   Physical  Exam  well-developed well-nourished in no acute distress well-groomed does not look acutely ill and she is nontoxic. HEENT grossly normal Neck no masses  Chest  cta neg cva pain CV no g or m   nl perfusion Abd soft without masses or organomegaly feels bloated no fluid wave  Inc BS  No  Tenderness or guarding or rebound.  Ext No clubbing cyanosis or edema        Assessment & Plan:   Constipation acute on chronic .Marland Kitchen Pretty impressive by history.  Denies blood or very large bowel movements.  Requests an antinausea medication. We'll treat with MiraLax get x-ray and GI referral. We did not do pregnancy test today she denies pregnancy risk.

## 2010-09-20 NOTE — Telephone Encounter (Signed)
Message copied by Tor Netters on Tue Sep 20, 2010  5:10 PM ------      Message from: [facility] [place] [facility]      Created: Tue Sep 20, 2010  1:24 PM       Tell  Patient that x ray shows  no definite obstruction.   Radiologist said sometimes an infection could do this but this doesn't act like this.    We sent in the order for  Gi to see you.

## 2010-09-20 NOTE — Telephone Encounter (Signed)
Left message to call back  

## 2010-09-20 NOTE — Assessment & Plan Note (Signed)
Acute on chronic since yyoung age  / age 40 ? Not since birth? REc gi consult ? dysmotility  ? doubt hirschprungs  . Will add miralax for now.  Get FP and upright to check for gas pattern.    Pt requests phenergan for nausea  Denies pregnancy

## 2010-09-21 NOTE — Progress Notes (Signed)
Pt aware of this. 

## 2010-09-21 NOTE — Progress Notes (Signed)
Left message to call back  

## 2010-09-22 ENCOUNTER — Telehealth: Payer: Self-pay | Admitting: Gastroenterology

## 2010-09-23 ENCOUNTER — Other Ambulatory Visit: Payer: BC Managed Care – PPO

## 2010-09-23 ENCOUNTER — Ambulatory Visit (INDEPENDENT_AMBULATORY_CARE_PROVIDER_SITE_OTHER): Payer: BC Managed Care – PPO | Admitting: Physician Assistant

## 2010-09-23 ENCOUNTER — Other Ambulatory Visit: Payer: Self-pay | Admitting: Physician Assistant

## 2010-09-23 ENCOUNTER — Encounter: Payer: Self-pay | Admitting: Physician Assistant

## 2010-09-23 DIAGNOSIS — R110 Nausea: Secondary | ICD-10-CM

## 2010-09-23 DIAGNOSIS — R1032 Left lower quadrant pain: Secondary | ICD-10-CM

## 2010-09-23 DIAGNOSIS — K5900 Constipation, unspecified: Secondary | ICD-10-CM

## 2010-09-23 LAB — CBC WITH DIFFERENTIAL/PLATELET
Basophils Absolute: 0.1 10*3/uL (ref 0.0–0.1)
Basophils Relative: 1 % (ref 0.0–3.0)
Eosinophils Absolute: 0.2 10*3/uL (ref 0.0–0.7)
Eosinophils Relative: 3.1 % (ref 0.0–5.0)
HCT: 31.9 % — ABNORMAL LOW (ref 36.0–46.0)
Hemoglobin: 10.4 g/dL — ABNORMAL LOW (ref 12.0–15.0)
Lymphocytes Relative: 35.3 % (ref 12.0–46.0)
Lymphs Abs: 2.5 10*3/uL (ref 0.7–4.0)
MCHC: 32.7 g/dL (ref 30.0–36.0)
MCV: 79.9 fl (ref 78.0–100.0)
Monocytes Absolute: 0.7 10*3/uL (ref 0.1–1.0)
Monocytes Relative: 10.3 % (ref 3.0–12.0)
Neutro Abs: 3.5 10*3/uL (ref 1.4–7.7)
Neutrophils Relative %: 50.3 % (ref 43.0–77.0)
Platelets: 254 10*3/uL (ref 150.0–400.0)
RBC: 3.99 Mil/uL (ref 3.87–5.11)
RDW: 20.8 % — ABNORMAL HIGH (ref 11.5–14.6)
WBC: 7 10*3/uL (ref 4.5–10.5)

## 2010-09-23 LAB — HIGH SENSITIVITY CRP: CRP, High Sensitivity: 1.7 mg/L (ref 0.00–5.00)

## 2010-09-23 LAB — TSH: TSH: 1.53 u[IU]/mL (ref 0.35–5.50)

## 2010-09-26 ENCOUNTER — Other Ambulatory Visit: Payer: Self-pay | Admitting: Gastroenterology

## 2010-09-26 ENCOUNTER — Ambulatory Visit (INDEPENDENT_AMBULATORY_CARE_PROVIDER_SITE_OTHER)
Admission: RE | Admit: 2010-09-26 | Discharge: 2010-09-26 | Disposition: A | Discharge status: Home / Self Care | Payer: BC Managed Care – PPO | Source: Ambulatory Visit | Attending: Gastroenterology | Admitting: Gastroenterology

## 2010-09-26 ENCOUNTER — Encounter: Payer: Self-pay | Admitting: Gastroenterology

## 2010-09-26 DIAGNOSIS — R109 Unspecified abdominal pain: Secondary | ICD-10-CM

## 2010-09-26 DIAGNOSIS — R1032 Left lower quadrant pain: Secondary | ICD-10-CM

## 2010-09-26 DIAGNOSIS — R194 Change in bowel habit: Secondary | ICD-10-CM

## 2010-09-26 HISTORY — DX: Acquired absence of other specified parts of digestive tract: Z90.49

## 2010-09-26 MED ORDER — IOHEXOL 300 MG/ML  SOLN
100.0000 mL | Freq: Once | INTRAMUSCULAR | Status: AC | PRN
  Administered 2010-09-26: 98 mL via INTRAVENOUS

## 2010-09-27 NOTE — Progress Notes (Signed)
Summary: Triage  Phone Note Call from Patient Call back at Work Phone [phone]   Caller: Patient Call For: Dr. Jarold Motto Reason for Call: Talk to Nurse Summary of Call: Constipation x3 wks, bloated and nausea. Took 3 Laxatives and 4 doses of miralax and No BM. Requesting sooner appt. than her appt. on 10-25-10 Initial call taken by: Karna Christmas,  September 22, 2010 9:34 AM  Follow-up for Phone Call        Patient saw Dr Fabian Sharp on 09/20/10 for constipation and possible bowel obstruction. Xrays were negative. Patient was instructed to take 2 caps of Miralax daily x 3 days. She reports she has taken the Miralax and Ex Lax since 09/20/10 w/o any results. She reports she is bloated and her abdomen is swollen. Dr Fabian Sharp reports constipation has been a chronic problem for patient for many years. Patient stated she saw Dr Jarold Motto when she was a child. Patient given an appointment with Mike Gip PAC in the am. Chart requested??? Follow-up by: Graciella Freer RN,  September 22, 2010 1:52 PM

## 2010-09-29 ENCOUNTER — Telehealth: Payer: Self-pay | Admitting: *Deleted

## 2010-09-29 NOTE — Telephone Encounter (Signed)
LMOM at home and work numbers for pt to call back. I need to schedule a COLON per Mike Gip, PA.

## 2010-09-29 NOTE — Telephone Encounter (Signed)
Scheduled pt for Pre Visit on 09/30/10 @3pm  and for COLON with Dr Jarold Motto on 10/05/10. Pt stated understanding.

## 2010-09-30 ENCOUNTER — Ambulatory Visit (AMBULATORY_SURGERY_CENTER): Payer: BC Managed Care – PPO

## 2010-09-30 DIAGNOSIS — K5900 Constipation, unspecified: Secondary | ICD-10-CM

## 2010-09-30 MED ORDER — MAGNESIUM CITRATE PO SOLN: ORAL | Status: AC

## 2010-09-30 MED ORDER — PEG-KCL-NACL-NASULF-NA ASC-C 100 G PO SOLR: 1.0000 | Freq: Once | ORAL | Status: AC

## 2010-10-04 ENCOUNTER — Encounter: Payer: Self-pay | Admitting: Gastroenterology

## 2010-10-05 ENCOUNTER — Ambulatory Visit (AMBULATORY_SURGERY_CENTER): Payer: BC Managed Care – PPO | Admitting: Gastroenterology

## 2010-10-05 ENCOUNTER — Encounter: Payer: Self-pay | Admitting: Gastroenterology

## 2010-10-05 VITALS — BP 140/96 | HR 78 | Temp 96.2°F | Resp 18 | Ht 65.0 in | Wt 168.0 lb

## 2010-10-05 DIAGNOSIS — K5904 Chronic idiopathic constipation: Secondary | ICD-10-CM

## 2010-10-05 DIAGNOSIS — K5900 Constipation, unspecified: Secondary | ICD-10-CM

## 2010-10-05 DIAGNOSIS — K589 Irritable bowel syndrome without diarrhea: Secondary | ICD-10-CM

## 2010-10-05 DIAGNOSIS — R109 Unspecified abdominal pain: Secondary | ICD-10-CM

## 2010-10-05 DIAGNOSIS — R933 Abnormal findings on diagnostic imaging of other parts of digestive tract: Secondary | ICD-10-CM

## 2010-10-05 NOTE — Patient Instructions (Signed)
Discharged instructions given with verbal understanding. Returned office visit.

## 2010-10-06 ENCOUNTER — Telehealth: Payer: Self-pay | Admitting: *Deleted

## 2010-10-06 NOTE — Assessment & Plan Note (Signed)
Summary: Constipation, no BM since 09/10/10, since 09/20/10, EX Lax and 3...   History of Present Illness Visit Type: new patient  Primary GI MD: Sheryn Bison MD FACP FAGA Primary Provider: Gershon Crane, MD  Requesting Provider: na Chief Complaint: Pt c/o constipation since 09-10-2010, and nausea. Pt has used several over the counter laxatives and no bowel movement.  History of Present Illness:    This is a pleasant 40 year old African American female known very remotely to Dr. Jarold Motto. She says she has a long history of irritable bowel with mild constipation. She has also just had GYN evaluation and has multiple uterine fibroids and is to be scheduled for hysterectomy.  She comes in today because of an abrupt change in her bowel habits over the past 2 weeks. She has also had associated nausea and bloating no vomiting or fever. Her appetite has been fine. She says generally she will have 1-2 bowel movements per week and now is not having any bowel movements despite several laxatives. She has been on MiraLax 2 doses daily over this past week with absolutely no results and took a bottle of mag citrate this past weekend after which he passed liquid stool but no solid. She has not noted any melena or hematochezia. She does complain of discomfort in her lower abdomen left greater than right. She is status post appendectomy has not had any other abdominal surgery.   Plain abdominal films were ordered her her primary on 313 2012 D. showed no evidence of obstruction she does have some gas and stool in the colon and a gas-filled small bowel loop in the right lower quadrant.  She has not had prior colonoscopy.   GI Review of Systems    Reports nausea.      Denies abdominal pain, acid reflux, belching, bloating, chest pain, dysphagia with liquids, dysphagia with solids, heartburn, loss of appetite, vomiting, vomiting blood, weight loss, and  weight gain.      Reports constipation.     Denies anal  fissure, black tarry stools, change in bowel habit, diarrhea, diverticulosis, fecal incontinence, heme positive stool, hemorrhoids, irritable bowel syndrome, jaundice, light color stool, liver problems, rectal bleeding, and  rectal pain.    Current Medications (verified): 1)  Klor-Con M20 20 Meq Cr-Tabs (Potassium Chloride Crys Cr) .... Once Daily 2)  Furosemide 40 Mg Tabs (Furosemide) .... Once Daily 3)  Vitamin D3 3000 Unit Tabs (Cholecalciferol) .Marland Kitchen.. 1  By Mouth Once Daily 4)  Maxalt 10 Mg Tabs (Rizatriptan Benzoate) .... As Needed For Has  Allergies (verified): 1)  ! Cipro  Past History:  Past Medical History: Allergic rhinitis Depression Hypertension sees Dr. Veto Kemps in [facility] for GYN exams vitamin D deficiency migraine HAs Arthritis Irritable Bowel Syndrome  Past Surgical History: Reviewed history from 02/05/2007 and no changes required. 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  Alcohol Use - no Daily Caffeine Use: 2 daily  Illicit Drug Use - no Smoking Status:  never Drug Use:  no  Review of Systems       The patient complains of fatigue and headaches-new.  The patient denies allergy/sinus, anemia, anxiety-new, arthritis/joint pain, back pain, blood in urine, breast changes/lumps, change in vision, confusion, cough, coughing up blood, depression-new, fainting, fever, hearing problems, heart murmur, heart rhythm changes, itching, menstrual pain, muscle pains/cramps, night sweats, nosebleeds, pregnancy  symptoms, shortness of breath, skin rash, sleeping problems, sore throat, swelling of feet/legs, swollen lymph glands, thirst - excessive , urination - excessive , urination changes/pain, urine leakage, vision changes, and voice change.         see hpi  Vital Signs:  Patient profile:   40 year old  female Height:      65 inches Weight:      166 pounds BMI:     27.72 BSA:     1.83 Pulse rate:   88 / minute Pulse rhythm:   regular BP sitting:   124 / 78  (left arm) Cuff size:   regular  Vitals Entered By: Ok Anis CMA (September 23, 2010 2:27 PM)  Physical Exam  General:  Well developed, well nourished, no acute distress. Head:  Normocephalic and atraumatic. Eyes:  PERRLA, no icterus. Lungs:  Clear throughout to auscultation. Heart:  Regular rate and rhythm; no murmurs, rubs,  or bruits. Abdomen:  soft, tender llq, no guarding, no mass or hsm,bs+ Rectal:  heme negative ,no stool in rectal vault. Neurologic:  Alert and  oriented x4;  grossly normal neurologically. Psych:  Alert and cooperative. Normal mood and affect.   Impression & Recommendations:  Problem # 1:  CONSTIPATION (ICD-564.00) Assessment Deteriorated  40 year old female with history of IBS with abrupt change in bowel habits with severe constipation. Rule out functional constipation , rule out occult colon lesion or inflammatory process.    check labs today as outlined below   Will purge bowel with a movi prep over the next 24-48 hours. If her constipation resolves and abdominal discomfort resolves we'll then proceed with colonoscopy with Dr. Jarold Motto to clear her bowel prior to undergoing hysterectomy. If she continues with abdominal discomfort after the bowel prep will proceed with CT scan abdomen and pelvis next week. This was discussed with the patient and her mother and they are agreeable.  Add Zofran 4 mg every 6 hours when necessary nausea  Problem # 2:  MIGRAINE HEADACHE (ICD-346.90) Assessment: Comment Only  Problem # 3:  HYPERTENSION (ICD-401.9) Assessment: Comment Only  Other Orders: TLB-CRP-High Sensitivity (C-Reactive Protein) (86140-FCRP) TLB-CBC Platelet - w/Differential (85025-CBCD) TLB-TSH (Thyroid Stimulating Hormone) (84443-TSH)  Patient Instructions: 1)  Please go to lab, basement  level. 2)  We sent the Moviprep to [facility]. 3)  Take the first dose, drinking a glass every 15 min.  Wait 1-2 hours then take the second dose drinking a glass every 15 min. 4)  Call our office Monday AM with a progress report.  Ask for Pam at [phone]. 5)  Copy sent to : Gershon Crane, MD 6)  The medication list was reviewed and reconciled.  All changed / newly prescribed medications were explained.  A complete medication list was provided to the patient / caregiver. Prescriptions: ZOFRAN 4 MG TABS (ONDANSETRON HCL) Take 1 tab every 6 hours as needed for nausea  #20 x 0   Entered by:   Lowry Ram NCMA   Authorized by:   Sammuel Cooper PA-c   Signed by:   Lowry Ram NCMA on 09/23/2010   Method used:   Electronically to        [facility]  [phone]* (retail)       [address]       [place], [place]  [address]       Ph: [phone] or [phone]       Fax: [phone]  RxID:   [id number] MOVIPREP 100 GM  SOLR (PEG-KCL-NACL-NASULF-NA ASC-C) As per prep instructions.  #1 x 0   Entered by:   Lowry Ram NCMA   Authorized by:   Sammuel Cooper PA-c   Signed by:   Lowry Ram NCMA on 09/23/2010   Method used:   Electronically to        [facility]  [phone]* (retail)       [address]       [place], [place]  [address]       Ph: [phone] or [phone]       Fax: [phone]   RxID:   [id number]

## 2010-10-06 NOTE — Telephone Encounter (Signed)
Follow up call attempted. Unidentified voice mail. No message left

## 2010-10-11 NOTE — Procedures (Signed)
Summary: Colonoscopy  Patient: [name] Note: All result statuses are Final unless otherwise noted.  Tests: (1) Colonoscopy (COL)   COL Colonoscopy           DONE     [facility]     [address]     [place], [place]  [address]          COLONOSCOPY PROCEDURE REPORT          PATIENT:  Beverly Rogers, Beverly Rogers  MR#:  [id number]     BIRTHDATE:  [birth year], 39 yrs. old  GENDER:  female     ENDOSCOPIST:  Vania Rea. Jarold Motto, MD, [facility]     REF. BY:  Tera Mater. Clent Ridges, M.D.     PROCEDURE DATE:  10/05/2010     PROCEDURE:  Diagnostic Colonoscopy     ASA CLASS:  Class I     INDICATIONS:  Abnormal CT of abdomen, Abdominal pain, constipation          MEDICATIONS:   Fentanyl 50 mcg IV, Versed 7 mg IV          DESCRIPTION OF PROCEDURE:   After the risks benefits and     alternatives of the procedure were thoroughly explained, informed     consent was obtained.  Digital rectal exam was performed and     revealed no abnormalities.   The LB CF-H180AL [id number] endoscope     was introduced through the anus and advanced to the cecum, which     was identified by both the appendix and ileocecal valve, without     limitations.  The quality of the prep was excellent, using     MoviPrep.  The instrument was then slowly withdrawn as the colon     was fully examined.     <<PROCEDUREIMAGES>>          FINDINGS:  No polyps or cancers were seen.  This was otherwise a     normal examination of the colon.   Retroflexed views in the rectum     revealed no abnormalities.    The scope was then withdrawn from     the patient and the procedure completed.          COMPLICATIONS:  None     ENDOSCOPIC IMPRESSION:     1) No polyps or cancers     2) Otherwise normal examination     CONSTIPATION PREDOMINANT IBS.     RECOMMENDATIONS:     1) high fiber diet     2) metamucil or benefiber     MIRALAX 8 OZS. QHS.OV 3 WEEKS.     REPEAT EXAM:  No          ______________________________     Vania Rea.  Jarold Motto, MD, [facility]          CC:          n.     eSIGNED:   Vania Rea. Toney Difatta at 10/05/2010 04:26 PM          Faustino Congress, [id number]  Note: An exclamation mark (!) indicates a result that was not dispersed into the flowsheet. Document Creation Date: 10/05/2010 4:26 PM _______________________________________________________________________  (1) Order result status: Final Collection or observation date-time: 10/05/2010 16:19 Requested date-time:  Receipt date-time:  Reported date-time:  Referring Physician:   Ordering Physician: Sheryn Bison [phone]) Specimen Source:  Source: Launa Grill Order Number: [id number] Lab site:

## 2010-10-25 ENCOUNTER — Ambulatory Visit (INDEPENDENT_AMBULATORY_CARE_PROVIDER_SITE_OTHER): Payer: BC Managed Care – PPO | Admitting: Gastroenterology

## 2010-10-25 ENCOUNTER — Other Ambulatory Visit (INDEPENDENT_AMBULATORY_CARE_PROVIDER_SITE_OTHER): Payer: BC Managed Care – PPO

## 2010-10-25 ENCOUNTER — Telehealth: Payer: Self-pay | Admitting: *Deleted

## 2010-10-25 ENCOUNTER — Encounter: Payer: Self-pay | Admitting: Gastroenterology

## 2010-10-25 DIAGNOSIS — K5900 Constipation, unspecified: Secondary | ICD-10-CM

## 2010-10-25 DIAGNOSIS — D649 Anemia, unspecified: Secondary | ICD-10-CM

## 2010-10-25 DIAGNOSIS — R110 Nausea: Secondary | ICD-10-CM

## 2010-10-25 LAB — HEPATIC FUNCTION PANEL
ALT: 14 U/L (ref 0–35)
AST: 22 U/L (ref 0–37)
Albumin: 3.4 g/dL — ABNORMAL LOW (ref 3.5–5.2)
Alkaline Phosphatase: 60 U/L (ref 39–117)
Bilirubin, Direct: 0 mg/dL (ref 0.0–0.3)
Total Bilirubin: 0.3 mg/dL (ref 0.3–1.2)
Total Protein: 6.6 g/dL (ref 6.0–8.3)

## 2010-10-25 LAB — IBC PANEL
Iron: 21 ug/dL — ABNORMAL LOW (ref 42–145)
Saturation Ratios: 4.4 % — ABNORMAL LOW (ref 20.0–50.0)
Transferrin: 342.5 mg/dL (ref 212.0–360.0)

## 2010-10-25 LAB — BASIC METABOLIC PANEL
BUN: 12 mg/dL (ref 6–23)
CO2: 26 mEq/L (ref 19–32)
Calcium: 8.7 mg/dL (ref 8.4–10.5)
Chloride: 109 mEq/L (ref 96–112)
Creatinine, Ser: 0.6 mg/dL (ref 0.4–1.2)
GFR: 143.9 mL/min (ref >60.00)
Glucose, Bld: 77 mg/dL (ref 70–99)
Potassium: 4.6 mEq/L (ref 3.5–5.1)
Sodium: 140 mEq/L (ref 135–145)

## 2010-10-25 LAB — CBC WITH DIFFERENTIAL/PLATELET
Basophils Absolute: 0 10*3/uL (ref 0.0–0.1)
Basophils Relative: 0.8 % (ref 0.0–3.0)
Eosinophils Absolute: 0.1 10*3/uL (ref 0.0–0.7)
Eosinophils Relative: 3.1 % (ref 0.0–5.0)
HCT: 30.5 % — ABNORMAL LOW (ref 36.0–46.0)
Hemoglobin: 9.7 g/dL — ABNORMAL LOW (ref 12.0–15.0)
Lymphocytes Relative: 35.6 % (ref 12.0–46.0)
Lymphs Abs: 1.7 10*3/uL (ref 0.7–4.0)
MCHC: 31.9 g/dL (ref 30.0–36.0)
MCV: 80.1 fl (ref 78.0–100.0)
Monocytes Absolute: 0.6 10*3/uL (ref 0.1–1.0)
Monocytes Relative: 12.4 % — ABNORMAL HIGH (ref 3.0–12.0)
Neutro Abs: 2.3 10*3/uL (ref 1.4–7.7)
Neutrophils Relative %: 48.1 % (ref 43.0–77.0)
Platelets: 245 10*3/uL (ref 150.0–400.0)
RBC: 3.81 Mil/uL — ABNORMAL LOW (ref 3.87–5.11)
RDW: 19.6 % — ABNORMAL HIGH (ref 11.5–14.6)
WBC: 4.8 10*3/uL (ref 4.5–10.5)

## 2010-10-25 LAB — FOLATE: Folate: 11.8 ng/mL (ref >5.9)

## 2010-10-25 LAB — FERRITIN: Ferritin: 6.4 ng/mL — ABNORMAL LOW (ref 10.0–291.0)

## 2010-10-25 LAB — TSH: TSH: 1.03 u[IU]/mL (ref 0.35–5.50)

## 2010-10-25 LAB — VITAMIN B12: Vitamin B-12: 299 pg/mL (ref 211–911)

## 2010-10-25 MED ORDER — TANDEM DHA 15-15-1 MG PO CAPS: 1.0000 | ORAL_CAPSULE | Freq: Every day | ORAL | Status: DC

## 2010-10-25 NOTE — Patient Instructions (Signed)
Please go to the basement today for your labs.   

## 2010-10-25 NOTE — Progress Notes (Signed)
This is a 40 year old African American female who has mild constipation having a bowel movement 2-3 times a week without rectal bleeding or abdominal pain. She has some abdominal distention and bloating and nausea, but denies upper gastrointestinal or hepatobiliary complaints. Recent colonoscopy was unremarkable. She suffers from peripheral edema of unexplained etiology requiring Lasix and potassium administration. Her anemia is related to heavy menstrual periods and uterine fibroids. She has a planned hysterectomy with Dr. Mare Ferrari  in [place], [place].  Current Medications, Allergies, Past Medical History, Past Surgical History, Family History and Social History were reviewed in [employer] record.  Pertinent Review of Systems Negative... she has a history of peripheral edema of unexplained etiology is always a temperature as her replacement.   Physical Exam: Attractive, awake, alert in no acute distress. Abdominal exam is unremarkable except for some tenderness the left lower quadrant to deep palpation. There is no hepatosplenomegaly, abdominal masses or tenderness. Bowel sounds are normal.   Assessment and Plan: Chronic functional constipation requiring slightly MiraLax. Her colonoscopy was entirely normal. We'll repeat her CBC and metabolic profile to exclude metabolic causes of nausea. I suspect her complaints are related to her uterine fibroids, and would agree with planned hysterectomy. Information concerning constipation given to the patient, and she is to continue MiraLax as tolerated.  Please copy her primary care physician, referring physician, and pertinent subspecialists. This includes Dr. Mare Ferrari , Gynecologist in [place], [place]. Encounter Diagnoses  Name Primary?  Marland Kitchen Anemia   . Constipation

## 2010-10-25 NOTE — Telephone Encounter (Signed)
Message copied by Harlow Mares on Tue Oct 25, 2010  1:59 PM ------      Message from: PATTERSON, DAVID      Created: Tue Oct 25, 2010  1:58 PM       Tandem daily for 3 mos and repeat cbc

## 2010-10-25 NOTE — Telephone Encounter (Signed)
Pt aware and rx sent i will call pt to remind her to have labs in 3 months

## 2010-11-16 HISTORY — PX: PARTIAL HYSTERECTOMY: SHX80

## 2010-11-25 NOTE — H&P (Signed)
[place]. [facility]  Patient:    DANIELLA, DEWBERRY                    MRN: [id number] Adm. Date:  [id number] Attending:  Otilio Saber                         History and Physical  IDENTIFYING DATA:  Ms. Bangerter is a 40 year old black married female who was admitted with a history of increasing depression and suicidal and homicidal ideation.  HISTORY OF PRESENT ILLNESS:  The patient reports a six-month history of worsening depression without clear precipitant.  She reports decreased sleep with frequent awakenings, decreased appetite, decreased energy, decreased interest, increased anxiety, crying spells, and both suicidal and homicidal ideations.  She reports feeling very out of control and has had thoughts of wanting to wreck her car on the highway.  She has also had thoughts of wanting to kill her husband so that he will not have to suffer and she will not hurt him.  She feels that everyone would be better off without her, and she complains that she is not able to please people.  Her suicidal thoughts have been very obsessive.  PAST PSYCHIATRIC HISTORY:  The patient denies any previous psychiatric evaluations.  She has been treated with Wellbutrin by her gynecologist and has been up to 200 mg b.i.d.  She reports a past history of some bingeing and purging.  PAST MEDICAL HISTORY:  The patient is followed by Dr. Lorenza Chick in [place], [place].  The patient has a history of hypertension but has not been taking medications.  ALLERGIES:  No known drug allergies.  SOCIAL HISTORY:  The patient has a college education with a degree in [place]. She works for an [employer] [place]  She has been married for a year and a half.  She has no children, but she and her husband have been trying to have children recently.  She denies any history of drug or alcohol abuse.  FAMILY HISTORY:  The patients father has a history of  depression.  REVIEW OF SYSTEMS:  Pending.  PHYSICAL EXAMINATION:  Pending.  MENTAL STATUS EXAMINATION:   The patient presents as a casually dressed young black female.  Speech is normal.  Thought processes are logical and coherent without evidence of psychosis.  She has suicidal and homicidal ideation.  Mood is depressed.  Affect is sad, and she is tearful throughout the interview. Oriented x 3.  Cognitive functions intact.  ADMITTING DIAGNOSES: Axis I:    Major depression, single episode, sever.e Axis II:   No diagnosis. Axis III:  Hypertension. Axis IV:   Psychosocial stressors:  Moderate. Axis V:    Global assessment of function:  Current 35, last year is 55.  TREATMENT PLAN:  We will decrease her Wellbutrin, as it may be increasing her anxiety and elevating her blood pressure.  We will try adding Prozac, Trazodone, and p.r.n. _____. DD:  02/27/00 TD:  02/28/00 Job: [id number]

## 2010-11-25 NOTE — Discharge Summary (Signed)
[facility]  Patient:    Beverly Rogers, Beverly Rogers                    MRN: [id number] Adm. Date:  [id number] Disc. Date: [id number] Attending:  Otilio Saber Dictator:   Johnella Moloney, N.P.                           Discharge Summary  DATE OF ADMISSION:  February 26, 2000  HISTORY OF PRESENT ILLNESS:  Beverly Rogers is a 40 year old, black, married female who was admitted with history of increasing depression and suicidal and homicidal ideation.  The patient reports a six-month history of worsening depression without clear precipitant.  She reports decreased sleep with frequent awakenings, decreased appetite, decreased energy, decreased interests, increased anxiety, crying spells, and both suicidal and homicidal ideation.  She reports feeling very out of control and has thoughts of wanting to wreck her car on the highway.  She has also had thoughts of wanting to kill her husband so that he will not have to suffer and she will not hurt him.  She feels that everyone would be better off without her and she complains that she is not able to please people.  Her suicidal thoughts have been very obsessive.  PAST PSYCHIATRIC HISTORY:  The patient denies any previous psychiatric evaluations.  Apparently, her gynecologist has treated her with Wellbutrin in the past at 200 mg b.i.d.  She also reports a past history of some binging and purging.  PAST MEDICAL HISTORY:  Patients primary care doctor is Dr. Lorenza Chick at [place], [place].  Patients medical problems include a history of hypertension but she takes no medication for this.  CURRENT MEDICATION:  Wellbutrin 200 mg b.i.d.  DRUG ALLERGIES:  No known drug allergies.  PHYSICAL EXAMINATION:  Please refer to physical exam prior to hospitalization.  LABORATORY WORK:  Her CBC with DIFF was within normal limits.  Her CMET was within normal limits.  Her thyroid profile was within normal limits.  Her urine  drug screen was negative.  Her urinalysis was within normal limits with exception of the urobilinogen 1.0 which was slightly high.  MENTAL STATUS EXAMINATION:  Patient presents as a casually dressed, young, black female.  Speech is normal.  Thought process is logical and apparent without evidence of psychosis.  She has suicidal/homicidal ideations.  Mood is depressed.  Affect is sad.  She is tearful throughout the interview.  Oriented x 3.  Cognitive function intact.  ADMISSION DIAGNOSES: Axis I:    Major depression, single episode, severe. Axis II:   No diagnosis. Axis III:  Hypertension. Axis IV:   Psychosocial stressors moderate. Axis V:    Global Assessment of Functioning, current 35, highest in the last            year 55.  INITIAL PLAN OF CARE:  We did decrease her Wellbutrin to 150 SR q.a.m. and q.12 p.m., added Prozac 20 mg q.a.m., along with the trazodone 50 p.o. at h.s. and Seroquel 12.5/25 mg p.o. q.4-6h. p.r.n.  On the day of discharge, the patient reported feeling better.  Her mood and affect were much brighter.  She slept well.  Appetite good.  She denied any suicidal ideation.  She was tolerating her meds well.  It was felt that she could be managed on an outpatient basis.  CONDITIONS NECESSARY FOR DISCHARGE:  On discharge, patient is discharged in improved condition with improvement  in her mood, sleep, appetite, alleviation of any suicidal ideation.  DISPOSITION:  The patient is discharged to home.  POST HOSPITAL CARE PLAN:  The patient is to follow up with Dr. Claudette Head on March 14, 2000 at 12:45 p.m.  Patient was also advised to contact family services at [place] to see a therapist.  DISCHARGE MEDICATIONS: 1. Wellbutrin 150 SR one tablet in the morning and at noon. 2. Prozac 20 one tablet q.a.m. 3. Trazodone 50 mg one tablet h.s. 4. Seroquel 25 mg one-half to one tablet q.i.d. as needed. 5. Ambien 10 mg one tablet at bedtime p.r.n. for sleep.  FINAL  DIAGNOSES: Axis I:    Major depression, single episode, severe. Axis II:   No diagnosis. Axis III:  Hypertension. Axis IV:   Psychosocial stressors moderate. Axis V:    Global Assessment of Functioning on discharge 52, highest in the            last year 55. DD:  03/28/00 TD:  03/30/00 Job: [id number]

## 2010-11-25 NOTE — Op Note (Signed)
[place]. [facility]  Patient:    Beverly Rogers, Beverly Rogers                    MRN: [id number] Proc. Date: 01/30/01 Adm. Date:  [id number] Disc. Date: [id number] Attending:  Retia Passe                           Operative Report  PREOPERATIVE DIAGNOSIS:  Maxillary and mandibular impacted third molar teeth numbers 1A, 1, 16, 16A, 17A, 17, 32.  History of chronic nervous disorders and use of psychotropic medications.  POSTOPERATIVE DIAGNOSIS:  Maxillary and mandibular impacted third molar teeth numbers 1A, 1, 16, 16A, 17A, 17, 32.  History of chronic nervous disorders and use of psychotropic medications.  OPERATION PERFORMED:  Removal of the above third molar teeth and supernumerary third molar teeth.  SURGEON:  Vania Rea. Warren Danes, D.D.S.  ASSISTANT:  Tinnie Gens.  ANESTHESIA:  General orotracheal.  ESTIMATED BLOOD LOSS:  Less than 50 cc.  FLUID REPLACEMENT:  Approximately 1000 cc crystalloid solutions.  COMPLICATIONS:  None apparent.  INDICATIONS FOR PROCEDURE:  The patient is a young lady who was referred to my office by her dentist for removal of her third molar teeth.  The patient has had supernumerary teeth and third molar impacted teeth.  She presents with a history of chronic psychotropic medication use due to chronic history of nervous disorders.  Due to her medical history, it was recommended that the teeth be removed under general anesthesia in an operating room setting.  DESCRIPTION OF PROCEDURE:  On January 29, 2001, the patient was taken to [facility] hospital operating suite where she was placed on the operating table in a supine position.  Following successful oral endotracheal intubation and general anesthesia, the patients face, neck, and oral cavity were prepped and draped in the usual sterile operating room fashion.  The hypopharynx was suctioned free of fluids and secretions and a moistened two inch vaginal pack was placed as a throat pack.   Attention was then directed intraorally, where approximately 10 cc of 0.5% Xylocaine containing 1:200,000 epinephrine were infiltrated in the right and left maxillary posterior superior alveolar nerve distributions, the corresponding palatal soft tissues, and the right and left inferior alveolar neurovascular regions.  Attention was then directed towards the right posterior maxillary arch, where a #15 Bard-Parker blade was used to create a full thickness mucoperiosteal incision around teeth numbers 1A and 1. A #9 Molt periosteal elevator was then used to reflect a full thickness mucoperiosteal flap laterally exposing the cortical bone around these teeth. The teeth were then subluxated from the alveolus using a 301 elevator and removed from the oral cavity using a 150 dental forcep.  In a similar fashion, teeth numbers 16 and 16A were removed as well.  The bony margins of these teeth were then smoothed with a small osseous file and the areas were thoroughly irrigated with sterile saline irrigating solutions and suctioned. The mucoperiosteal margins were approximated and sutured in an interrupted fashion using 4-0 chromic suture material on a PS2 needle.  Attention was then directed towards the left posterior mandibular arch, where a #15 Bard Parker blade was used to create a full thickness mucoperiosteal incision over tooth #17A.  A full thickness mucoperiosteal flap was then reflected laterally exposing tooth #17A and tooth #17.  A Stryker rotary osteotome was then used to section tooth #17A in its horizontal axis.  Tooth #17A was then  subluxated from the alveolus using a 301 elevator and removed from the oral cavity using rongeurs and forceps.  Tooth #17 was then subluxated from the alveolus using an 11A elevator and removed from the oral cavity using a 23 dental forcep. The bony margins were then smoothed and contoured using a Stryker rotator osteotome and a #8 round bur.  The surgical  site was thoroughly irrigated with sterile saline irrigating solutions and suctioned.  The mucoperiosteal margins were then approximated and sutured in an interrupted using 4-0 chromic suture material on a PS2 needle.  Attention was then directed towards the right posterior mandibular arch, where a full thickness mucoperiosteal incision as made around tooth #32.  Then the mucoperiosteal margins were elevated laterally, exposing the cortical bone.  Tooth #32 was then sectioned in its vertical long axes using a Stryker rotary osteotome and a 107 fissure bur. The tooth was then subluxated from the alveolus using an 11A elevator and removed using rongeurs and forceps.  The bony margins were then smoothed using a Stryker rotary osteotome and a #8 round bur.  The surgical site was then thoroughly irrigated with sterile saline irrigating solutions and suctioned. The mucoperiosteal margins were then approximated and sutured in an interrupted fashion using 4-0 chromic suture material on a PS2 needle.  The oral cavity was then thoroughly irrigated and then suctioned using a tonsillar suction.  The throat pack was removed and the hypopharynx suctioned free of fluids and secretions.  The patient was allowed to awaken from the anesthesia and taken to the recovery room where she tolerated the procedure well and without apparent complications. DD:  01/30/01 TD:  01/30/01 Job: [id number]

## 2010-12-30 ENCOUNTER — Telehealth: Payer: Self-pay | Admitting: *Deleted

## 2010-12-30 NOTE — Telephone Encounter (Signed)
Pt states the Paxil had made her gain 20 lbs, and since her TAH, she is more depressed.  Would like t change meds.  [employer].

## 2010-12-30 NOTE — Telephone Encounter (Signed)
Left message on pt's voice mail with pt's permission.

## 2010-12-30 NOTE — Telephone Encounter (Signed)
Okay to come off Paxil, but she needs to taper off slowly. Take 1/2 tablet a day for one week, then 1/2 tablet every other day for one week , then stop. Start on Wellbutrin XL 150 mg a day. Call in #30 with 2 rf, and see me in one month

## 2011-01-10 ENCOUNTER — Telehealth: Payer: Self-pay | Admitting: *Deleted

## 2011-01-10 MED ORDER — BUPROPION HCL ER (XL) 150 MG PO TB24: 150.0000 mg | ORAL_TABLET | ORAL | Status: DC

## 2011-01-10 NOTE — Telephone Encounter (Signed)
Pt needs Wellbutrin sent to pharmacy.

## 2011-01-30 ENCOUNTER — Telehealth: Payer: Self-pay | Admitting: *Deleted

## 2011-01-30 NOTE — Telephone Encounter (Signed)
Message copied by Leonette Monarch on Mon Jan 30, 2011  2:34 PM ------      Message from: Harlow Mares D      Created: Tue Oct 25, 2010  2:18 PM       Needs cbc

## 2011-01-30 NOTE — Telephone Encounter (Signed)
Yes.iron still low.Marland KitchenMarland Kitchen

## 2011-01-30 NOTE — Telephone Encounter (Signed)
Pt had labs done in June they are scanned in under the lab tab, does she need to continue her tandem??

## 2011-01-30 NOTE — Telephone Encounter (Signed)
Left message on machine.

## 2011-01-31 ENCOUNTER — Other Ambulatory Visit: Payer: Self-pay | Admitting: *Deleted

## 2011-01-31 MED ORDER — TANDEM DHA 15-15-1 MG PO CAPS: 1.0000 | ORAL_CAPSULE | Freq: Every day | ORAL | Status: DC

## 2011-04-01 ENCOUNTER — Other Ambulatory Visit: Payer: Self-pay | Admitting: Family Medicine

## 2011-04-03 ENCOUNTER — Other Ambulatory Visit (HOSPITAL_COMMUNITY): Payer: Self-pay | Admitting: Unknown Physician Specialty

## 2011-04-03 DIAGNOSIS — Z1231 Encounter for screening mammogram for malignant neoplasm of breast: Secondary | ICD-10-CM

## 2011-05-08 ENCOUNTER — Ambulatory Visit (HOSPITAL_COMMUNITY)
Admission: RE | Admit: 2011-05-08 | Discharge: 2011-05-08 | Disposition: A | Discharge status: Home / Self Care | Payer: BC Managed Care – PPO | Source: Ambulatory Visit | Attending: Unknown Physician Specialty | Admitting: Unknown Physician Specialty

## 2011-05-08 DIAGNOSIS — Z1231 Encounter for screening mammogram for malignant neoplasm of breast: Secondary | ICD-10-CM | POA: Insufficient documentation

## 2011-05-30 ENCOUNTER — Other Ambulatory Visit: Payer: Self-pay | Admitting: Family Medicine

## 2011-05-30 NOTE — Telephone Encounter (Signed)
Pt called req refill of furosemide (LASIX) 40 MG tablet to [facility] on [address].

## 2011-05-31 MED ORDER — FUROSEMIDE 40 MG PO TABS: 40.0000 mg | ORAL_TABLET | Freq: Every day | ORAL | Status: DC

## 2011-05-31 NOTE — Telephone Encounter (Signed)
rx sent to pharmacy for lasix 40 mg.  Only #30-pt needs an office visit.

## 2011-07-31 ENCOUNTER — Ambulatory Visit (INDEPENDENT_AMBULATORY_CARE_PROVIDER_SITE_OTHER): Payer: BC Managed Care – PPO | Admitting: Family Medicine

## 2011-07-31 ENCOUNTER — Encounter: Payer: Self-pay | Admitting: Family Medicine

## 2011-07-31 VITALS — BP 108/76 | HR 87 | Temp 98.7°F | Wt 181.0 lb

## 2011-07-31 DIAGNOSIS — J069 Acute upper respiratory infection, unspecified: Secondary | ICD-10-CM

## 2011-07-31 MED ORDER — HYDROCODONE-HOMATROPINE 5-1.5 MG/5ML PO SYRP: 5.0000 mL | ORAL_SOLUTION | ORAL | Status: AC | PRN

## 2011-07-31 MED ORDER — FUROSEMIDE 40 MG PO TABS: 40.0000 mg | ORAL_TABLET | Freq: Every day | ORAL | Status: DC

## 2011-07-31 NOTE — Progress Notes (Signed)
  Subjective:    Patient ID: Beverly Rogers, female    DOB: Aug 08, 1970, 41 y.o.   MRN: [id number]  HPI Here for 3 days of PND, ST, hoarseness, and a dry cough. No fever. Drinking fluids   Review of Systems  Constitutional: Negative.   HENT: Positive for postnasal drip. Negative for congestion and sinus pressure.   Eyes: Negative.   Respiratory: Positive for cough.        Objective:   Physical Exam  Constitutional: She appears well-developed and well-nourished.  HENT:  Right Ear: External ear normal.  Left Ear: External ear normal.  Nose: Nose normal.  Mouth/Throat: Oropharynx is clear and moist. No oropharyngeal exudate.  Eyes: Conjunctivae are normal.  Pulmonary/Chest: Effort normal and breath sounds normal.  Lymphadenopathy:    She has no cervical adenopathy.          Assessment & Plan:  Recheck prn

## 2011-08-08 ENCOUNTER — Telehealth: Payer: Self-pay | Admitting: *Deleted

## 2011-08-08 NOTE — Telephone Encounter (Signed)
Pt had a health screening at work and HDL was 34.  Wants to know if Dr. Clent Ridges thinks she needs any intervention?

## 2011-08-09 NOTE — Telephone Encounter (Signed)
Tell her to take a fish oil capsule daily

## 2011-08-09 NOTE — Telephone Encounter (Signed)
Left voice message.

## 2011-11-02 ENCOUNTER — Ambulatory Visit: Payer: BC Managed Care – PPO | Admitting: Family Medicine

## 2011-11-03 ENCOUNTER — Encounter: Payer: Self-pay | Admitting: Family Medicine

## 2011-11-03 ENCOUNTER — Ambulatory Visit (INDEPENDENT_AMBULATORY_CARE_PROVIDER_SITE_OTHER): Payer: BC Managed Care – PPO | Admitting: Family Medicine

## 2011-11-03 VITALS — BP 108/80 | HR 86 | Temp 98.8°F | Wt 178.0 lb

## 2011-11-03 DIAGNOSIS — N390 Urinary tract infection, site not specified: Secondary | ICD-10-CM

## 2011-11-03 LAB — POCT URINALYSIS DIPSTICK
Bilirubin, UA: NEGATIVE
Glucose, UA: NEGATIVE
Ketones, UA: NEGATIVE
Nitrite, UA: NEGATIVE
Spec Grav, UA: >=1.03
Urobilinogen, UA: 0.2
pH, UA: 6

## 2011-11-03 MED ORDER — NITROFURANTOIN MONOHYD MACRO 100 MG PO CAPS: 100.0000 mg | ORAL_CAPSULE | Freq: Two times a day (BID) | ORAL | Status: AC

## 2011-11-03 NOTE — Progress Notes (Signed)
Addended by: Aniceto Boss A on: 11/03/2011 01:32 PM   Modules accepted: Orders

## 2011-11-03 NOTE — Progress Notes (Signed)
  Subjective:    Patient ID: Beverly Rogers, female    DOB: [birth year], 41 y.o.   MRN: [id number]  HPI Here for 2 days of urinary urgency and pressure. No burning. No fever. She has slight nausea. She drinks plenty of water.    Review of Systems  Constitutional: Negative.   Gastrointestinal: Negative.   Genitourinary: Positive for urgency and frequency. Negative for dysuria, hematuria and difficulty urinating.       Objective:   Physical Exam  Constitutional: She appears well-developed and well-nourished.  Abdominal: Soft. Bowel sounds are normal. She exhibits no distension and no mass. There is no tenderness. There is no rebound and no guarding.          Assessment & Plan:  Recheck prn

## 2011-11-29 ENCOUNTER — Ambulatory Visit (INDEPENDENT_AMBULATORY_CARE_PROVIDER_SITE_OTHER): Payer: BC Managed Care – PPO | Admitting: Family Medicine

## 2011-11-29 ENCOUNTER — Encounter: Payer: Self-pay | Admitting: Family Medicine

## 2011-11-29 VITALS — BP 110/80 | HR 73 | Temp 99.3°F | Wt 182.0 lb

## 2011-11-29 DIAGNOSIS — N390 Urinary tract infection, site not specified: Secondary | ICD-10-CM

## 2011-11-29 LAB — POCT URINALYSIS DIPSTICK
Bilirubin, UA: NEGATIVE
Blood, UA: NEGATIVE
Glucose, UA: NEGATIVE
Ketones, UA: NEGATIVE
Nitrite, UA: NEGATIVE
Spec Grav, UA: 1.015
Urobilinogen, UA: 1
pH, UA: 7

## 2011-11-29 MED ORDER — SULFAMETHOXAZOLE-TRIMETHOPRIM 800-160 MG PO TABS: 1.0000 | ORAL_TABLET | Freq: Two times a day (BID) | ORAL | Status: DC

## 2011-11-29 NOTE — Progress Notes (Signed)
  Subjective:    Patient ID: Beverly Rogers, female    DOB: [birth year], 41 y.o.   MRN: [id number]  HPI Here for recurrent UTI symptoms. She was here a month ago and was given a week of Macrobid. Her symptoms improved for awhile but now they are back. These include urgency, frequency, burning, and a mild fever.    Review of Systems  Constitutional: Positive for fever.  Gastrointestinal: Negative.   Genitourinary: Positive for dysuria, urgency and frequency.       Objective:   Physical Exam  Constitutional: She appears well-developed and well-nourished.  Abdominal: Soft. Bowel sounds are normal. She exhibits no distension and no mass. There is no tenderness. There is no rebound and no guarding.          Assessment & Plan:  Culture the urine today. Try Bactrim DS

## 2011-12-02 LAB — URINE CULTURE: Colony Count: >=100000

## 2012-01-17 ENCOUNTER — Encounter: Payer: Self-pay | Admitting: Family Medicine

## 2012-01-17 ENCOUNTER — Ambulatory Visit (INDEPENDENT_AMBULATORY_CARE_PROVIDER_SITE_OTHER): Payer: BC Managed Care – PPO | Admitting: Family Medicine

## 2012-01-17 VITALS — BP 110/78 | HR 61 | Temp 98.5°F | Wt 186.0 lb

## 2012-01-17 DIAGNOSIS — R5381 Other malaise: Secondary | ICD-10-CM

## 2012-01-17 DIAGNOSIS — N390 Urinary tract infection, site not specified: Secondary | ICD-10-CM

## 2012-01-17 DIAGNOSIS — I10 Essential (primary) hypertension: Secondary | ICD-10-CM

## 2012-01-17 LAB — POCT URINALYSIS DIPSTICK
Bilirubin, UA: NEGATIVE
Blood, UA: NEGATIVE
Glucose, UA: NEGATIVE
Ketones, UA: NEGATIVE
Leukocytes, UA: NEGATIVE
Nitrite, UA: NEGATIVE
Protein, UA: NEGATIVE
Spec Grav, UA: >=1.03
Urobilinogen, UA: 0.2
pH, UA: 6

## 2012-01-17 LAB — CBC WITH DIFFERENTIAL/PLATELET
Basophils Absolute: 0 10*3/uL (ref 0.0–0.1)
Basophils Relative: 0.6 % (ref 0.0–3.0)
Eosinophils Absolute: 0.1 10*3/uL (ref 0.0–0.7)
Eosinophils Relative: 2.3 % (ref 0.0–5.0)
HCT: 36 % (ref 36.0–46.0)
Hemoglobin: 11.4 g/dL — ABNORMAL LOW (ref 12.0–15.0)
Lymphocytes Relative: 44 % (ref 12.0–46.0)
Lymphs Abs: 2.3 10*3/uL (ref 0.7–4.0)
MCHC: 31.6 g/dL (ref 30.0–36.0)
MCV: 83 fl (ref 78.0–100.0)
Monocytes Absolute: 0.5 10*3/uL (ref 0.1–1.0)
Monocytes Relative: 10.2 % (ref 3.0–12.0)
Neutro Abs: 2.2 10*3/uL (ref 1.4–7.7)
Neutrophils Relative %: 42.9 % — ABNORMAL LOW (ref 43.0–77.0)
Platelets: 222 10*3/uL (ref 150.0–400.0)
RBC: 4.33 Mil/uL (ref 3.87–5.11)
RDW: 18.1 % — ABNORMAL HIGH (ref 11.5–14.6)
WBC: 5.1 10*3/uL (ref 4.5–10.5)

## 2012-01-17 MED ORDER — SULFAMETHOXAZOLE-TRIMETHOPRIM 800-160 MG PO TABS: 1.0000 | ORAL_TABLET | Freq: Two times a day (BID) | ORAL | Status: DC

## 2012-01-17 NOTE — Progress Notes (Signed)
  Subjective:    Patient ID: Beverly Rogers, female    DOB: September 16, 1970, 40 y.o.   MRN: [id number]  HPI Here for 3 days of burning on urinations. No urgency or frequency, no fever or nausea. She was treated for a Klebsiella UTI with Bactrim DS 2 months ago, and her symptoms resolved completely.    Review of Systems  Constitutional: Negative.   Gastrointestinal: Negative.   Genitourinary: Positive for dysuria. Negative for urgency, frequency, hematuria, flank pain, difficulty urinating and pelvic pain.       Objective:   Physical Exam  Constitutional: She appears well-developed and well-nourished.  Abdominal: Soft. Bowel sounds are normal. She exhibits no distension and no mass. There is no tenderness. There is no rebound and no guarding.          Assessment & Plan:  Treat with Bactrim DS. Drink water.

## 2012-01-18 LAB — LIPID PANEL
Cholesterol: 183 mg/dL (ref 0–200)
HDL: 46.3 mg/dL (ref >39.00)
LDL Cholesterol: 117 mg/dL — ABNORMAL HIGH (ref 0–99)
Total CHOL/HDL Ratio: 4
Triglycerides: 100 mg/dL (ref 0.0–149.0)
VLDL: 20 mg/dL (ref 0.0–40.0)

## 2012-01-18 LAB — BASIC METABOLIC PANEL
BUN: 8 mg/dL (ref 6–23)
CO2: 23 mEq/L (ref 19–32)
Calcium: 8.9 mg/dL (ref 8.4–10.5)
Chloride: 109 mEq/L (ref 96–112)
Creatinine, Ser: 0.8 mg/dL (ref 0.4–1.2)
GFR: 96.48 mL/min (ref >60.00)
Glucose, Bld: 79 mg/dL (ref 70–99)
Potassium: 4.3 mEq/L (ref 3.5–5.1)
Sodium: 139 mEq/L (ref 135–145)

## 2012-01-18 LAB — HEPATIC FUNCTION PANEL
ALT: 11 U/L (ref 0–35)
AST: 17 U/L (ref 0–37)
Albumin: 3.8 g/dL (ref 3.5–5.2)
Alkaline Phosphatase: 57 U/L (ref 39–117)
Bilirubin, Direct: 0 mg/dL (ref 0.0–0.3)
Total Bilirubin: 0.5 mg/dL (ref 0.3–1.2)
Total Protein: 7.3 g/dL (ref 6.0–8.3)

## 2012-01-18 LAB — VITAMIN B12: Vitamin B-12: 255 pg/mL (ref 211–911)

## 2012-01-18 LAB — VITAMIN D 25 HYDROXY (VIT D DEFICIENCY, FRACTURES): Vit D, 25-Hydroxy: 21 ng/mL — ABNORMAL LOW (ref 30–89)

## 2012-01-18 LAB — TSH: TSH: 1.62 u[IU]/mL (ref 0.35–5.50)

## 2012-01-22 ENCOUNTER — Ambulatory Visit (INDEPENDENT_AMBULATORY_CARE_PROVIDER_SITE_OTHER): Payer: BC Managed Care – PPO | Admitting: Family Medicine

## 2012-01-22 ENCOUNTER — Encounter: Payer: Self-pay | Admitting: Family Medicine

## 2012-01-22 VITALS — BP 128/80 | HR 64 | Temp 98.7°F | Wt 183.0 lb

## 2012-01-22 DIAGNOSIS — L509 Urticaria, unspecified: Secondary | ICD-10-CM

## 2012-01-22 DIAGNOSIS — N390 Urinary tract infection, site not specified: Secondary | ICD-10-CM

## 2012-01-22 MED ORDER — METHYLPREDNISOLONE ACETATE 80 MG/ML IJ SUSP
120.0000 mg | Freq: Once | INTRAMUSCULAR | Status: AC
  Administered 2012-01-22: 120 mg via INTRAMUSCULAR

## 2012-01-22 NOTE — Addendum Note (Signed)
Addended by: Aniceto Boss A on: 01/22/2012 01:03 PM   Modules accepted: Orders

## 2012-01-22 NOTE — Progress Notes (Signed)
  Subjective:    Patient ID: Beverly Rogers, female    DOB: 02-15-1971, 41 y.o.   MRN: [id number]  HPI Here for hives which came on yesterday. She was here 5 days ago for a UTI, and she was given Bactrim DS for this. She actually took this several months ago and did quite well then. However this time she has diffuse itchy rashes all over her body. No SOB or wheezing. Using Benadryl.    Review of Systems  Constitutional: Negative.   HENT: Negative.   Respiratory: Negative.   Skin: Positive for rash.       Objective:   Physical Exam  Constitutional: She appears well-developed and well-nourished.  Pulmonary/Chest: Effort normal and breath sounds normal.  Skin:       Diffuse urticaria over the body, sparing the face           Assessment & Plan:  Hives, as an allergic reaction to Bactrim. Given a steroid shot, and she will use Benadryl prn

## 2012-01-22 NOTE — Progress Notes (Signed)
Quick Note:  I spoke with pt ______ 

## 2012-01-23 ENCOUNTER — Ambulatory Visit (INDEPENDENT_AMBULATORY_CARE_PROVIDER_SITE_OTHER): Payer: BC Managed Care – PPO | Admitting: Family Medicine

## 2012-01-23 ENCOUNTER — Encounter: Payer: Self-pay | Admitting: Family Medicine

## 2012-01-23 VITALS — BP 120/78 | HR 132 | Temp 98.4°F | Wt 186.0 lb

## 2012-01-23 DIAGNOSIS — L509 Urticaria, unspecified: Secondary | ICD-10-CM

## 2012-01-23 NOTE — Progress Notes (Signed)
  Subjective:    Patient ID: Beverly Rogers, female    DOB: [birth year], 41 y.o.   MRN: [id number]  HPI Here to follow up on hives, which is felt to be an allergic reaction to Bactrim DS she had been taking for a UTI. She was seen here yesterday and was given a shot of Depomedrol. She is taking one Benadryl every 4 hours. She tried to go back to work yesterday but felt bad, so they took her to see Dr. Chilton Si, their on site doctor. He gave her a shot of Benadryl and told her to take Zantac. She took one 75 mg Zantac last night only. This morning she woke up with swelling in the face and this alarmed her. No SOB or wheezing. Now the swelling has gone down quite a bit.    Review of Systems  Constitutional: Negative.   Respiratory: Negative.   Cardiovascular: Negative.   Skin: Positive for rash.       Objective:   Physical Exam  Constitutional: She appears well-developed and well-nourished. No distress.  Skin:       Scattered urticaria. Her face is slightly puffy           Assessment & Plan:  She will stay off work today. Take Benadryl 50 mg every 4 hours, and add Zantac 300 mg bid. Recheck prn

## 2012-04-12 ENCOUNTER — Other Ambulatory Visit (HOSPITAL_COMMUNITY): Payer: Self-pay | Admitting: Unknown Physician Specialty

## 2012-04-12 DIAGNOSIS — Z1231 Encounter for screening mammogram for malignant neoplasm of breast: Secondary | ICD-10-CM

## 2012-05-08 ENCOUNTER — Ambulatory Visit (HOSPITAL_COMMUNITY)
Admission: RE | Admit: 2012-05-08 | Discharge: 2012-05-08 | Disposition: A | Discharge status: Home / Self Care | Payer: BC Managed Care – PPO | Source: Ambulatory Visit | Attending: Unknown Physician Specialty | Admitting: Unknown Physician Specialty

## 2012-05-08 DIAGNOSIS — Z1231 Encounter for screening mammogram for malignant neoplasm of breast: Secondary | ICD-10-CM | POA: Insufficient documentation

## 2012-10-15 ENCOUNTER — Telehealth: Payer: Self-pay | Admitting: Family Medicine

## 2012-10-15 MED ORDER — FUROSEMIDE 40 MG PO TABS: 40.0000 mg | ORAL_TABLET | Freq: Every day | ORAL | Status: DC

## 2012-10-15 MED ORDER — PROMETHAZINE HCL 25 MG PO TABS: 25.0000 mg | ORAL_TABLET | Freq: Four times a day (QID) | ORAL | Status: DC | PRN

## 2012-10-15 NOTE — Telephone Encounter (Signed)
Refills sent in

## 2012-10-30 IMAGING — CT CT ABD-PELV W/ CM
2 of 5 series · 16 of 46 positions shown, 18 images · IV contrast (Omnipaque 300)
Comparison: Abdominal radiographs 09/20/2010.

CLINICAL DATA: Left lower quadrant pain and new onset constipation.
Recently diagnosed fibroid.

CT ABDOMEN AND PELVIS WITH CONTRAST
TECHNIQUE: Multidetector CT imaging of the abdomen and pelvis was
performed following the standard protocol during bolus
administration of intravenous contrast.
Contrast: 98 ml Gmnipaque-4KK.

[Series 2: abd/ pel 5mm · axial · 0.70mm/px · z∈[-438,-42]mm · 13 of 89 slices shown, 15 images]
[im 5/89  soft-tissue]
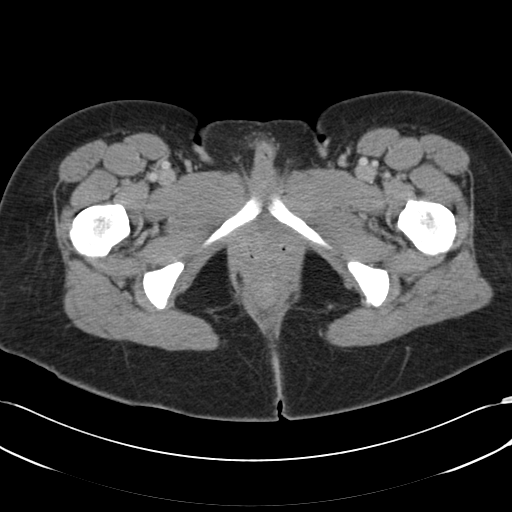
[im 5/89  bone]
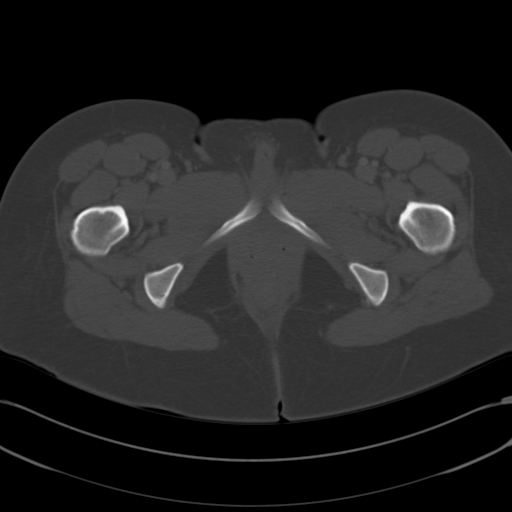
[im 14/89  soft-tissue]
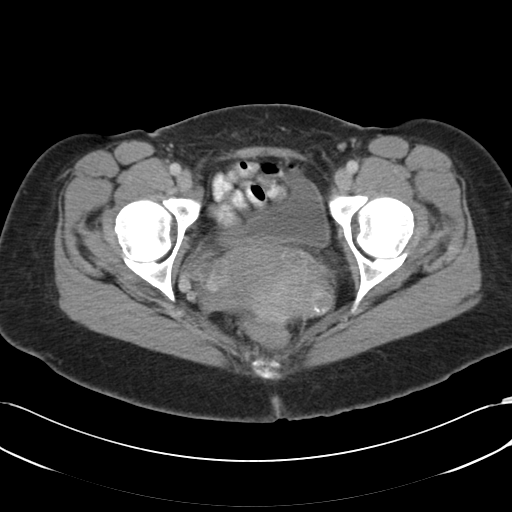
[im 18/89  soft-tissue]
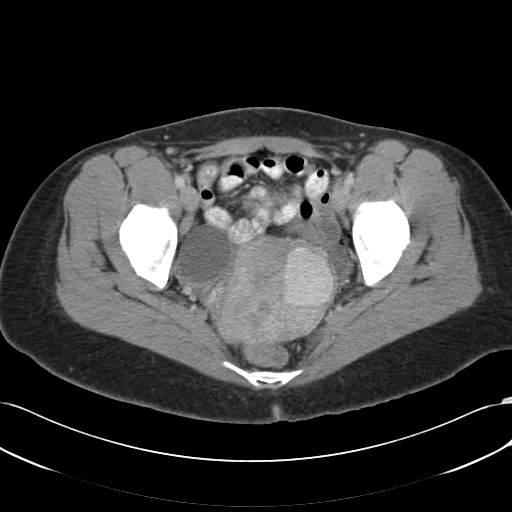
[im 27/89  soft-tissue]
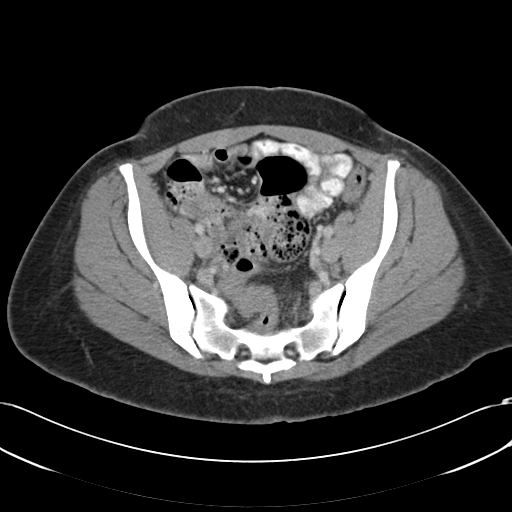
[im 31/89  soft-tissue]
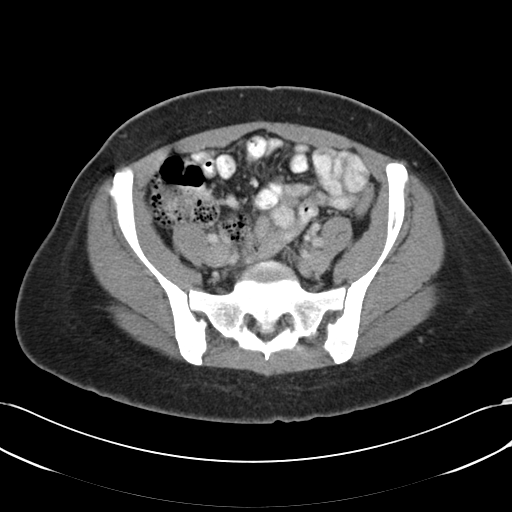
[im 40/89  soft-tissue]
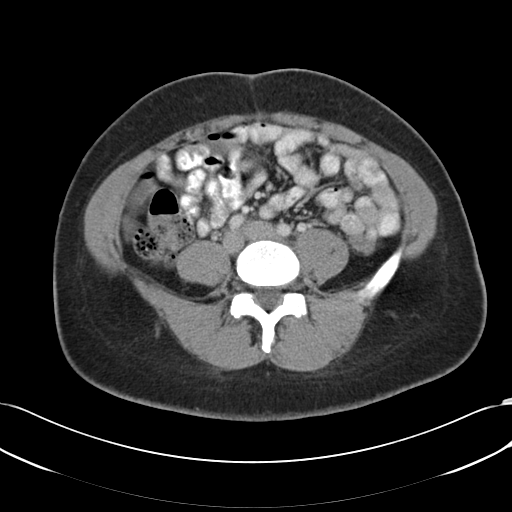
[im 45/89  soft-tissue]
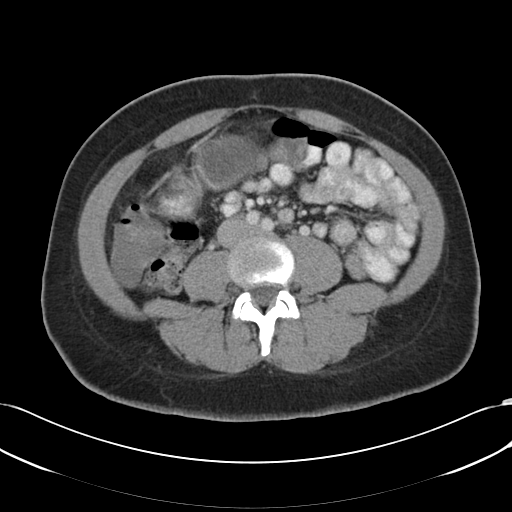
[im 49/89  soft-tissue]
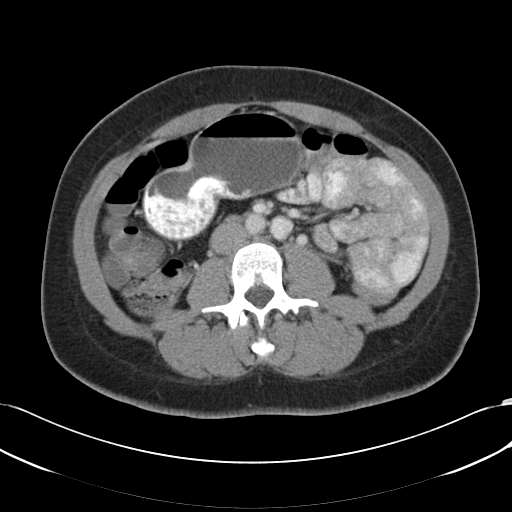
[im 58/89  soft-tissue]
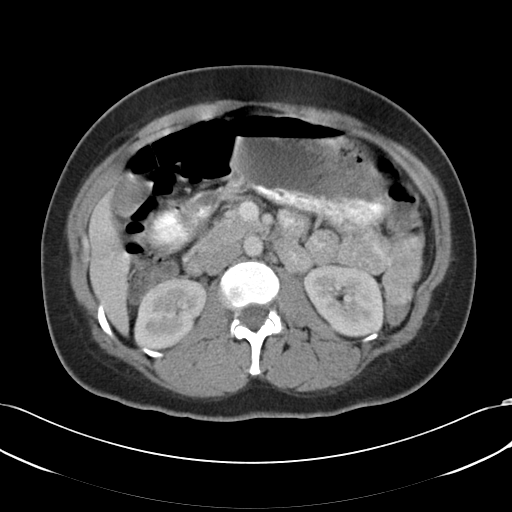
[im 58/89  bone]
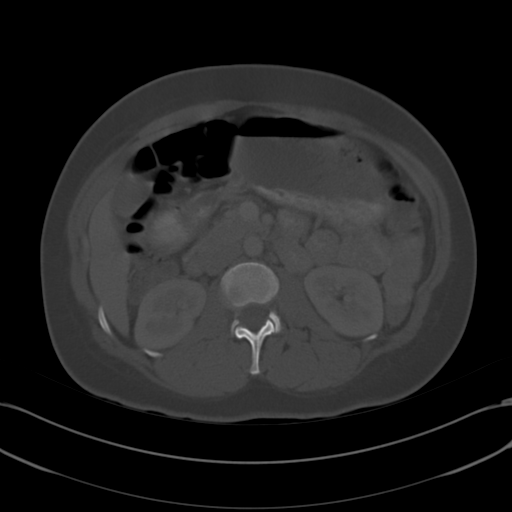
[im 62/89  soft-tissue]
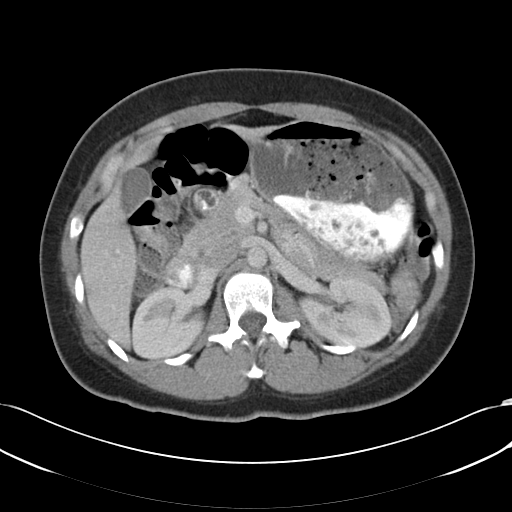
[im 71/89  soft-tissue]
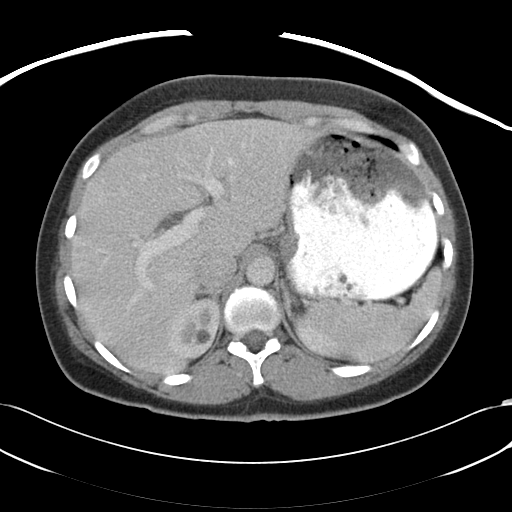
[im 75/89  soft-tissue]
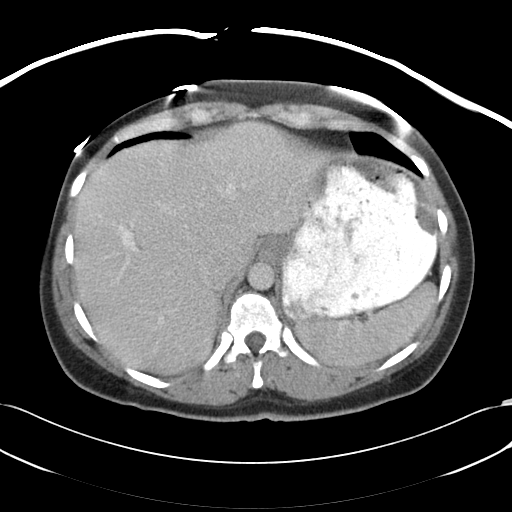
[im 84/89  soft-tissue]
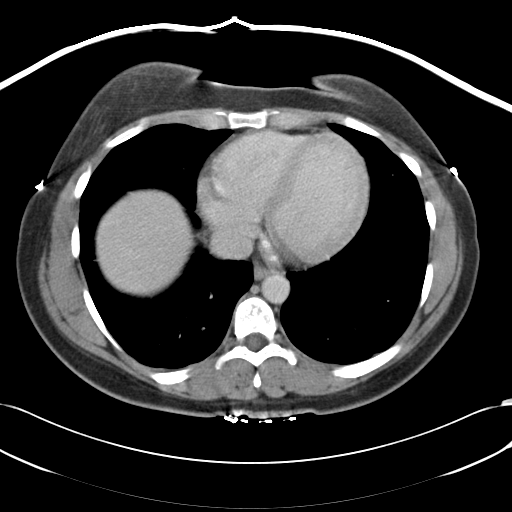

[Series 602: cor · coronal · 0.90mm/px · 3 of 97 slices shown]
[im 33/97  soft-tissue]
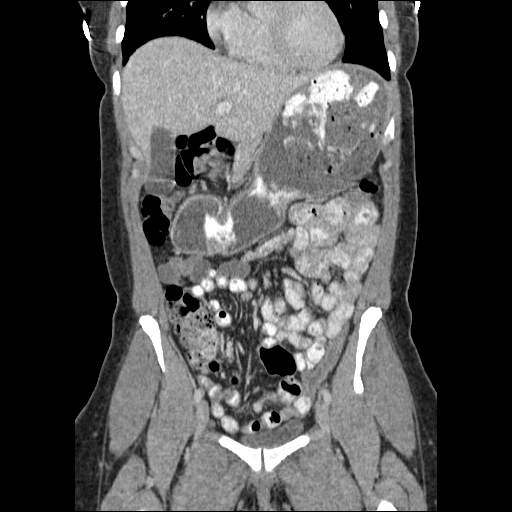
[im 43/97  soft-tissue]
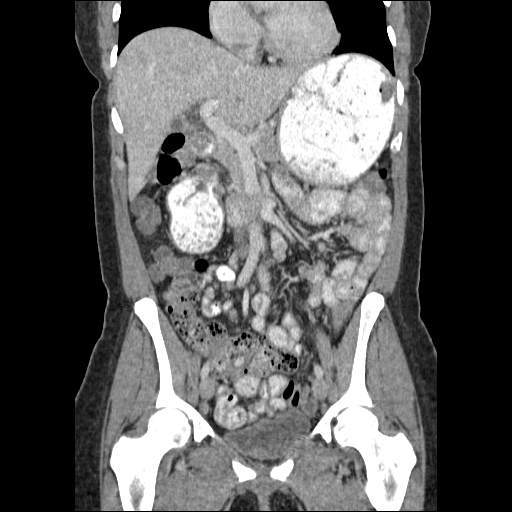
[im 54/97  soft-tissue]
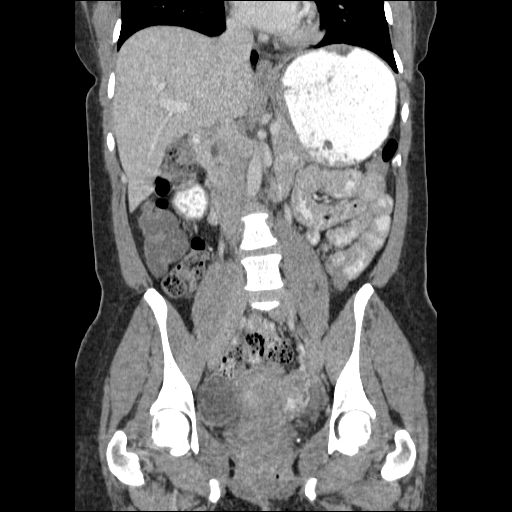

[16 of 46 positions shown; findings below may reference images not displayed]

FINDINGS: The lung bases are clear.

The liver, gallbladder, spleen, adrenal glands, pancreas, and left
kidney are within normal limits.  In the upper pole of the right
kidney is a 9 mm cyst.  There is no hydronephrosis.

Small bowel loops are normal in caliber and wall thickness.  Colon
is normal in caliber wall thickness.  There is a moderate amount of
stool in the sigmoid colon. Appendix is not definitely visualized.
No CT findings to suggest acute appendicitis.

Abdominal aorta is normal in caliber.

There is no ascites or lymphadenopathy.

The right ovary contains a 4 cm cyst.  No left adnexal mass is
seen.

The uterus is lobular in contour, mildly prominent in size and
retroverted.  Uterus is very heterogeneous in enhancement, and has
several ill-defined areas of decreased enhancement, as well as a
more defined oval area of increased enhancement in the left aspect
of the uterus that measures 3.9 x 3.2 x 3.6 cm.

There is no free fluid.  Urinary bladder bladder is within normal
limits.  Inguinal regions are unremarkable.  Bones within normal
limits.
IMPRESSION: 1.  Uterus is mildly enlarged, lobular, and heterogeneously
enhances, suggesting the presence of of uterine fibroids.  There is
an area of hyperenhancement in the left aspect of the uterus, which
is atypical for fibroids (fibroids are typically hypoenhancing
compared to the remainder of the uterus).  Pelvic ultrasound or
pelvic MRI could be considered for further evaluation.
2.  4 cm right ovarian cyst.  This has simple appearances by CT,
but can be more definitively characterized with ultrasound or MRI
if performed.
3.  Moderate amount of stool in the sigmoid colon.

## 2013-04-08 ENCOUNTER — Ambulatory Visit (INDEPENDENT_AMBULATORY_CARE_PROVIDER_SITE_OTHER): Payer: BC Managed Care – PPO | Admitting: Family Medicine

## 2013-04-08 ENCOUNTER — Encounter: Payer: Self-pay | Admitting: Family Medicine

## 2013-04-08 VITALS — BP 120/80 | HR 81 | Temp 98.6°F | Wt 173.0 lb

## 2013-04-08 DIAGNOSIS — S90129A Contusion of unspecified lesser toe(s) without damage to nail, initial encounter: Secondary | ICD-10-CM

## 2013-04-08 DIAGNOSIS — Z23 Encounter for immunization: Secondary | ICD-10-CM

## 2013-04-08 DIAGNOSIS — S90122A Contusion of left lesser toe(s) without damage to nail, initial encounter: Secondary | ICD-10-CM

## 2013-04-08 MED ORDER — CEPHALEXIN 500 MG PO CAPS: 500.0000 mg | ORAL_CAPSULE | Freq: Three times a day (TID) | ORAL | Status: AC

## 2013-04-08 NOTE — Progress Notes (Signed)
  Subjective:    Patient ID: Beverly Rogers, female    DOB: 1970/08/09, 42 y.o.   MRN: [id number]  HPI Here to check her left great toe which she injured at home on 04-02-13. That day she stubbed it against a shoe which was against a wall. It was painful but the pain quickly resolved. It never swelled. Then last night it started to ooze blood at the base of the nail. She is very concerned.    Review of Systems  Constitutional: Negative.        Objective:   Physical Exam  Constitutional: She appears well-developed and well-nourished. No distress.  Musculoskeletal:  The toe appears normal with no erythema or swelling. No tenderness, full ROM. The nail is not loose. There is a small area of dried blood at the base of the nail.           Assessment & Plan:  It appears that she simply disrupted the nail matrix and this is bleeding slightly. It should heal up fine and cause no further problems. I do not think the nail will come off. Cover with a Bandaid and recheck prn

## 2013-04-09 ENCOUNTER — Encounter: Payer: Self-pay | Admitting: Family Medicine

## 2013-04-16 ENCOUNTER — Telehealth: Payer: Self-pay | Admitting: Family Medicine

## 2013-04-16 NOTE — Telephone Encounter (Signed)
Patient Information:  Caller Name: [facility]  Phone: [phone]  Patient: Beverly Rogers, Beverly Rogers  Gender: Female  DOB: 09-05-1970  Age: 42 Years  PCP: Gershon Crane [facility])  Pregnant: No  Office Follow Up:  Does the office need to follow up with this patient?: No  Instructions For The Office: N/A   Symptoms  Reason For Call & Symptoms: Calling about having pain discomfort in back of R side of neck that radiates down into back- onset 04/15/13. She is stressed and has tight muscles/tension in neck. Hx pinched nerve with same sx in 2009. Was treated in the ER at the time. She took Baclofen and a hot shower last night and helped some.  Reviewed Health History In EMR: Yes  Reviewed Medications In EMR: Yes  Reviewed Allergies In EMR: Yes  Reviewed Surgeries / Procedures: Yes  Date of Onset of Symptoms: 04/15/2013 OB / GYN:  LMP: Unknown  Guideline(s) Used:  Neck Pain or Stiffness  Disposition Per Guideline:   See Within 3 Days in Office  Reason For Disposition Reached:   Moderate neck pain (e.g., interferes with normal activities like work or school)  Advice Given:  Apply Cold to the Area Or Heat:   During the first 2 days after a mild injury, apply a cold pack or an ice bag (wrapped in a towel) for 20 minutes four times a day. After 2 days, apply a heating pad or hot water bottle to the most painful area for 20 minutes whenever the pain flares up. Wrap hot water bottles or heating pads in a towel to avoid burns.  Stretching Exercises:  After 48 hours of protecting the neck, begin gentle stretching exercises.  Improve the tone of the neck muscles with 2 or 3 minutes of gentle stretching exercises per day such as touching the chin to each shoulder, touching the ear to each shoulder, and moving the head forward and backward.  Avoid:   Avoid triggers that overstress the neck such as working with the neck turned or bent backward, carrying heavy objects on the head, carrying heavy  objects with one arm (instead of both arms), standing on the head, contact sports or even friendly wrestling.  Call Back If:  Numbness or weakness occurs  Bowel or bladder problems occur  Pain lasts for more than 2 weeks  You become worse.  Patient Refused Recommendation:  Patient Will Make Own Appointment  She will call for appointment if sx not improving within 2 days.

## 2013-04-17 NOTE — Telephone Encounter (Signed)
Pt asked if you would give her a call this am. Pt refused to give any details except she called yesterday and message attached.

## 2013-04-18 MED ORDER — FLUCONAZOLE 150 MG PO TABS: 150.0000 mg | ORAL_TABLET | Freq: Once | ORAL | Status: DC

## 2013-04-18 MED ORDER — CYCLOBENZAPRINE HCL 10 MG PO TABS: 10.0000 mg | ORAL_TABLET | Freq: Three times a day (TID) | ORAL | Status: DC | PRN

## 2013-04-18 MED ORDER — DICLOFENAC SODIUM 75 MG PO TBEC: 75.0000 mg | DELAYED_RELEASE_TABLET | Freq: Two times a day (BID) | ORAL | Status: DC

## 2013-04-18 MED ORDER — FLUOXETINE HCL 20 MG PO TABS: 20.0000 mg | ORAL_TABLET | Freq: Every day | ORAL | Status: DC

## 2013-04-18 NOTE — Telephone Encounter (Signed)
Pt called and requested to speak with Nettie Elm. Please advise.

## 2013-04-18 NOTE — Telephone Encounter (Signed)
Call in Flexeril 10 mg tid prn muscle spasms, #60 with 2 rf. Also call in Diclofenac 75 mg bid prn pain, #60 with 2 rf. She also wanted to try Prozac for stress so call in Prozac 20 mg daily, #30 with 2 rf. Follow up with me in 2 weeks

## 2013-04-18 NOTE — Telephone Encounter (Signed)
I sent all 4 scripts e-scribe and spoke with pt

## 2013-04-18 NOTE — Telephone Encounter (Signed)
Also call in Diflucan 150 mg prn, #1 with 5 rf

## 2013-04-22 ENCOUNTER — Other Ambulatory Visit (HOSPITAL_COMMUNITY): Payer: Self-pay | Admitting: Unknown Physician Specialty

## 2013-04-22 DIAGNOSIS — Z1231 Encounter for screening mammogram for malignant neoplasm of breast: Secondary | ICD-10-CM

## 2013-05-02 ENCOUNTER — Telehealth: Payer: Self-pay | Admitting: Family Medicine

## 2013-05-02 NOTE — Telephone Encounter (Signed)
Patient Information:  Caller Name: Josepha  Phone: [phone]  Patient: Beverly Rogers, Beverly Rogers  Gender: Female  DOB: October 28, 1970  Age: 42 Years  PCP: Gershon Crane [facility])  Pregnant: No  Office Follow Up:  Does the office need to follow up with this patient?: No  Instructions For The Office: N/A  RN Note:  Will treat per the uncomplicated cystitis SO with Macrobid 100mg . one poBID x 3 days to [facility] [address] [phone]). Call back if does not see improvement. Care Advice per Guidelines.  Symptoms  Reason For Call & Symptoms: Three wks ago, injured toe. Office visit with Dr. Clent Ridges. Went on antibiotic. Then got yeast infection. Was prescribed Diflucan. Now has UTI with foul odor and pressure. Allergic to Cipro and Sulfa. Has tried increasing fluids but that has not really taken care of the problem.  Reviewed Health History In EMR: Yes  Reviewed Medications In EMR: Yes  Reviewed Allergies In EMR: Yes  Reviewed Surgeries / Procedures: Yes  Date of Onset of Symptoms: 04/29/2013  Treatments Tried: Increasing fluids  Treatments Tried Worked: No OB / GYN:  LMP: Unknown  Guideline(s) Used:  Urination Pain - Female  Disposition Per Guideline:   See Today in Office  Reason For Disposition Reached:   Painful urination AND EITHER frequency or urgency  Advice Given:  Fluids:   Drink extra fluids. Drink 8-10 glasses of liquids a day (Reason: to produce a dilute, non-irritating urine).  Warm Saline SITZ Baths to Reduce Pain:  Sit in a warm saline bath for 20 minutes to cleanse the area and to reduce pain. Add 2 oz. of table salt or baking soda to a tub of water.  Cranberry Juice:   Some people think that drinking cranberry juice may help in fighting urinary tract infections. However, there is no good research that has ever proved this.  Dosage Cranberry Juice Cocktail: 8 oz (240 ml) twice a day.  Dosage 100% Cranberry Juice: 1 oz (30 ml) twice a day.  Recommended  Antibiotic  : Bactrim DS (trimethoprim-sulfamethoxazole; 1 PO BID for 3 days), MacroBid (nitrofurantoin; 1 PO BID for 3 days), or Cipro (ciprofloxacin; 250 mg PO BID for 3 days).  Call Back If:   Fever lasts more than 24 hours on antibiotics  Pain does not improve by day 3 on antibiotics  Urine symptoms do not improve by day 3 on antibiotics  You become worse.  Patient Will Follow Care Advice:  YES

## 2013-05-09 ENCOUNTER — Ambulatory Visit (HOSPITAL_COMMUNITY)
Admission: RE | Admit: 2013-05-09 | Discharge: 2013-05-09 | Disposition: A | Discharge status: Home / Self Care | Payer: BC Managed Care – PPO | Source: Ambulatory Visit | Attending: Unknown Physician Specialty | Admitting: Unknown Physician Specialty

## 2013-05-09 DIAGNOSIS — Z1231 Encounter for screening mammogram for malignant neoplasm of breast: Secondary | ICD-10-CM | POA: Insufficient documentation

## 2013-09-19 ENCOUNTER — Encounter: Payer: Self-pay | Admitting: Family Medicine

## 2013-09-19 ENCOUNTER — Ambulatory Visit (INDEPENDENT_AMBULATORY_CARE_PROVIDER_SITE_OTHER): Payer: BC Managed Care – PPO | Admitting: Family Medicine

## 2013-09-19 VITALS — BP 148/98 | HR 89 | Temp 98.0°F | Ht 65.0 in | Wt 189.0 lb

## 2013-09-19 DIAGNOSIS — G43909 Migraine, unspecified, not intractable, without status migrainosus: Secondary | ICD-10-CM

## 2013-09-19 DIAGNOSIS — R202 Paresthesia of skin: Secondary | ICD-10-CM

## 2013-09-19 DIAGNOSIS — I10 Essential (primary) hypertension: Secondary | ICD-10-CM

## 2013-09-19 DIAGNOSIS — R209 Unspecified disturbances of skin sensation: Secondary | ICD-10-CM

## 2013-09-19 LAB — POCT URINALYSIS DIPSTICK
Bilirubin, UA: NEGATIVE
Blood, UA: NEGATIVE
Glucose, UA: NEGATIVE
Ketones, UA: NEGATIVE
Leukocytes, UA: NEGATIVE
Nitrite, UA: NEGATIVE
Protein, UA: NEGATIVE
Spec Grav, UA: 1.025
Urobilinogen, UA: 0.2
pH, UA: 6

## 2013-09-19 LAB — BASIC METABOLIC PANEL
BUN: 11 mg/dL (ref 6–23)
CO2: 25 mEq/L (ref 19–32)
Calcium: 9.4 mg/dL (ref 8.4–10.5)
Chloride: 104 mEq/L (ref 96–112)
Creatinine, Ser: 0.8 mg/dL (ref 0.4–1.2)
GFR: 102.85 mL/min (ref >60.00)
Glucose, Bld: 85 mg/dL (ref 70–99)
Potassium: 3.9 mEq/L (ref 3.5–5.1)
Sodium: 138 mEq/L (ref 135–145)

## 2013-09-19 LAB — CBC WITH DIFFERENTIAL/PLATELET
Basophils Absolute: 0 10*3/uL (ref 0.0–0.1)
Basophils Relative: 0.5 % (ref 0.0–3.0)
Eosinophils Absolute: 0.1 10*3/uL (ref 0.0–0.7)
Eosinophils Relative: 2.8 % (ref 0.0–5.0)
HCT: 40.7 % (ref 36.0–46.0)
Hemoglobin: 13.2 g/dL (ref 12.0–15.0)
Lymphocytes Relative: 42.7 % (ref 12.0–46.0)
Lymphs Abs: 2.2 10*3/uL (ref 0.7–4.0)
MCHC: 32.4 g/dL (ref 30.0–36.0)
MCV: 86 fl (ref 78.0–100.0)
Monocytes Absolute: 0.5 10*3/uL (ref 0.1–1.0)
Monocytes Relative: 9.2 % (ref 3.0–12.0)
Neutro Abs: 2.3 10*3/uL (ref 1.4–7.7)
Neutrophils Relative %: 44.8 % (ref 43.0–77.0)
Platelets: 298 10*3/uL (ref 150.0–400.0)
RBC: 4.73 Mil/uL (ref 3.87–5.11)
RDW: 14.8 % — ABNORMAL HIGH (ref 11.5–14.6)
WBC: 5.2 10*3/uL (ref 4.5–10.5)

## 2013-09-19 LAB — HEPATIC FUNCTION PANEL
ALT: 14 U/L (ref 0–35)
AST: 21 U/L (ref 0–37)
Albumin: 4.4 g/dL (ref 3.5–5.2)
Alkaline Phosphatase: 72 U/L (ref 39–117)
Bilirubin, Direct: 0 mg/dL (ref 0.0–0.3)
Total Bilirubin: 0.5 mg/dL (ref 0.3–1.2)
Total Protein: 8.3 g/dL (ref 6.0–8.3)

## 2013-09-19 LAB — LIPID PANEL
Cholesterol: 188 mg/dL (ref 0–200)
HDL: 39.6 mg/dL (ref >39.00)
LDL Cholesterol: 125 mg/dL — ABNORMAL HIGH (ref 0–99)
Total CHOL/HDL Ratio: 5
Triglycerides: 118 mg/dL (ref 0.0–149.0)
VLDL: 23.6 mg/dL (ref 0.0–40.0)

## 2013-09-19 LAB — VITAMIN B12: Vitamin B-12: 456 pg/mL (ref 211–911)

## 2013-09-19 MED ORDER — LISINOPRIL 10 MG PO TABS: 10.0000 mg | ORAL_TABLET | Freq: Every day | ORAL | Status: DC

## 2013-09-19 MED ORDER — RIZATRIPTAN BENZOATE 10 MG PO TABS: 10.0000 mg | ORAL_TABLET | ORAL | Status: DC | PRN

## 2013-09-19 NOTE — Progress Notes (Signed)
Pre visit review using our clinic review tool, if applicable. No additional management support is needed unless otherwise documented below in the visit note. 

## 2013-09-19 NOTE — Progress Notes (Signed)
   Subjective:    Patient ID: Beverly Rogers, female    DOB: 02/15/1971, 43 y.o.   MRN: [id number]  HPI Here for elevated BP and HAs. She has had a left frontal HA off and on for the past week. This is the typical area where she gets migraines. She has run out of Maxalt. Also she had a wellness check at her job 2 weeks ago and her BP was little high (she does not remember the numbers) and it was high there yesterday.    Review of Systems  Constitutional: Negative.   Eyes: Negative.   Respiratory: Negative.   Cardiovascular: Negative.   Neurological: Positive for headaches.       Objective:   Physical Exam  Constitutional: She is oriented to person, place, and time. She appears well-developed and well-nourished. No distress.  Eyes: Conjunctivae are normal. Pupils are equal, round, and reactive to light.  Neck: No thyromegaly present.  Cardiovascular: Normal rate, regular rhythm, normal heart sounds and intact distal pulses.   Pulmonary/Chest: Effort normal and breath sounds normal. No respiratory distress.  Lymphadenopathy:    She has no cervical adenopathy.  Neurological: She is alert and oriented to person, place, and time.          Assessment & Plan:  She now has HTN so we will start her on Lisinopril. Use Maxalt for the migraines. Get labs. Recheck in 3 weeks.

## 2013-09-22 ENCOUNTER — Telehealth: Payer: Self-pay | Admitting: Family Medicine

## 2013-09-22 LAB — TSH: TSH: 1.92 u[IU]/mL (ref 0.35–5.50)

## 2013-09-22 NOTE — Telephone Encounter (Signed)
Relevant patient education mailed to patient.  

## 2013-10-03 ENCOUNTER — Ambulatory Visit (INDEPENDENT_AMBULATORY_CARE_PROVIDER_SITE_OTHER): Payer: BC Managed Care – PPO | Admitting: Family Medicine

## 2013-10-03 ENCOUNTER — Encounter: Payer: Self-pay | Admitting: Family Medicine

## 2013-10-03 VITALS — BP 130/88 | Temp 99.6°F | Ht 65.0 in | Wt 188.0 lb

## 2013-10-03 DIAGNOSIS — M542 Cervicalgia: Secondary | ICD-10-CM

## 2013-10-03 MED ORDER — TRIAMCINOLONE ACETONIDE 0.1 % EX CREA: 1.0000 "application " | TOPICAL_CREAM | Freq: Three times a day (TID) | CUTANEOUS | Status: DC

## 2013-10-03 MED ORDER — METHYLPREDNISOLONE ACETATE 80 MG/ML IJ SUSP
120.0000 mg | Freq: Once | INTRAMUSCULAR | Status: AC
  Administered 2013-10-03: 120 mg via INTRAMUSCULAR

## 2013-10-03 MED ORDER — HYDROCODONE-ACETAMINOPHEN 10-325 MG PO TABS: 1.0000 | ORAL_TABLET | Freq: Three times a day (TID) | ORAL | Status: DC | PRN

## 2013-10-03 NOTE — Progress Notes (Signed)
Pre visit review using our clinic review tool, if applicable. No additional management support is needed unless otherwise documented below in the visit note. 

## 2013-10-03 NOTE — Addendum Note (Signed)
Addended by: Colleen Can on: 10/03/2013 11:56 AM   Modules accepted: Orders

## 2013-10-03 NOTE — Progress Notes (Signed)
   Subjective:    Patient ID: Beverly Rogers, female    DOB: Jun 10, 1971, 43 y.o.   MRN: [id number]  HPI Here for one week of pain in the right neck area which radiates down the right arm. Some numbness and tingling in the arm but no weakness. Using heat, Baclofen, and Diclofenac.    Review of Systems  Constitutional: Negative.   Musculoskeletal: Positive for neck pain.       Objective:   Physical Exam  Constitutional: She appears well-developed and well-nourished.  Musculoskeletal:  Tender in the right posterior neck with reduced ROM and a lot of spasm.           Assessment & Plan:  Given a steroid shot. Use Vicodin prn

## 2013-10-07 ENCOUNTER — Telehealth: Payer: Self-pay | Admitting: Family Medicine

## 2013-10-07 DIAGNOSIS — M542 Cervicalgia: Secondary | ICD-10-CM

## 2013-10-07 NOTE — Telephone Encounter (Signed)
I spoke with pt  

## 2013-10-07 NOTE — Telephone Encounter (Signed)
Pt was seen on 10-03-13. Pt is still having same symptoms. Pt saw dr Domingo Cocking and he has schedule pt for Nerve conduction study. Pt would like to preceded with MRI

## 2013-10-07 NOTE — Telephone Encounter (Signed)
The MRI was ordered 

## 2013-10-16 ENCOUNTER — Telehealth: Payer: Self-pay | Admitting: Family Medicine

## 2013-10-16 NOTE — Telephone Encounter (Signed)
Pt is scheduled for MRI on 10/17/13 and her [employer] still needs a pre-cert.

## 2013-10-17 ENCOUNTER — Ambulatory Visit
Admission: RE | Admit: 2013-10-17 | Discharge: 2013-10-17 | Disposition: A | Discharge status: Home / Self Care | Payer: BC Managed Care – PPO | Source: Ambulatory Visit | Attending: Family Medicine | Admitting: Family Medicine

## 2013-10-17 DIAGNOSIS — M542 Cervicalgia: Secondary | ICD-10-CM

## 2013-10-21 NOTE — Addendum Note (Signed)
Addended by: Alysia Penna A on: 10/21/2013 12:57 PM   Modules accepted: Orders

## 2013-11-04 ENCOUNTER — Ambulatory Visit (INDEPENDENT_AMBULATORY_CARE_PROVIDER_SITE_OTHER): Payer: BC Managed Care – PPO | Admitting: Family Medicine

## 2013-11-04 ENCOUNTER — Encounter: Payer: Self-pay | Admitting: Family Medicine

## 2013-11-04 VITALS — BP 113/82 | HR 75 | Temp 98.5°F | Ht 65.0 in | Wt 179.0 lb

## 2013-11-04 DIAGNOSIS — N390 Urinary tract infection, site not specified: Secondary | ICD-10-CM

## 2013-11-04 LAB — POCT URINALYSIS DIPSTICK
Bilirubin, UA: NEGATIVE
Blood, UA: NEGATIVE
Glucose, UA: NEGATIVE
Ketones, UA: NEGATIVE
Spec Grav, UA: 1.02
Urobilinogen, UA: 0.2
pH, UA: 6

## 2013-11-04 MED ORDER — NITROFURANTOIN MONOHYD MACRO 100 MG PO CAPS: 100.0000 mg | ORAL_CAPSULE | Freq: Two times a day (BID) | ORAL | Status: DC

## 2013-11-04 NOTE — Addendum Note (Signed)
Addended by: Aggie Hacker A on: 11/04/2013 11:43 AM   Modules accepted: Orders

## 2013-11-04 NOTE — Progress Notes (Signed)
   Subjective:    Patient ID: Beverly Rogers, female    DOB: 1970-08-03, 43 y.o.   MRN: [id number]  HPI Here for 7 days of urinary urgency and burning. No fever. She drinks plenty of water daily. She had a Klebsiella UTI one year ago that responded to Crary.    Review of Systems  Constitutional: Negative.   Genitourinary: Positive for dysuria, urgency and frequency. Negative for flank pain and pelvic pain.       Objective:   Physical Exam  Constitutional: She appears well-developed and well-nourished.  Abdominal: Soft. Bowel sounds are normal. She exhibits no distension and no mass. There is no tenderness. There is no rebound and no guarding.          Assessment & Plan:  Culture the sample and treat with Macrobid.

## 2013-11-04 NOTE — Progress Notes (Signed)
Pre visit review using our clinic review tool, if applicable. No additional management support is needed unless otherwise documented below in the visit note. 

## 2013-11-07 LAB — URINE CULTURE: Colony Count: >=100000

## 2013-12-06 ENCOUNTER — Other Ambulatory Visit: Payer: Self-pay | Admitting: Family Medicine

## 2014-05-14 ENCOUNTER — Other Ambulatory Visit (HOSPITAL_COMMUNITY): Payer: Self-pay | Admitting: Unknown Physician Specialty

## 2014-05-14 DIAGNOSIS — Z1231 Encounter for screening mammogram for malignant neoplasm of breast: Secondary | ICD-10-CM

## 2014-06-08 ENCOUNTER — Other Ambulatory Visit (HOSPITAL_COMMUNITY): Payer: Self-pay | Admitting: Unknown Physician Specialty

## 2014-06-08 ENCOUNTER — Ambulatory Visit (HOSPITAL_COMMUNITY)
Admission: RE | Admit: 2014-06-08 | Discharge: 2014-06-08 | Disposition: A | Discharge status: Home / Self Care | Payer: BC Managed Care – PPO | Source: Ambulatory Visit | Attending: Unknown Physician Specialty | Admitting: Unknown Physician Specialty

## 2014-06-08 DIAGNOSIS — Z1231 Encounter for screening mammogram for malignant neoplasm of breast: Secondary | ICD-10-CM

## 2014-07-28 ENCOUNTER — Other Ambulatory Visit: Payer: Self-pay | Admitting: Family Medicine

## 2014-07-28 NOTE — Telephone Encounter (Signed)
Can we refill these? 

## 2014-11-02 ENCOUNTER — Other Ambulatory Visit: Payer: Self-pay | Admitting: Family Medicine

## 2014-12-21 ENCOUNTER — Other Ambulatory Visit: Payer: Self-pay | Admitting: Family Medicine

## 2015-02-24 ENCOUNTER — Other Ambulatory Visit: Payer: Self-pay | Admitting: Family Medicine

## 2015-03-01 ENCOUNTER — Ambulatory Visit (INDEPENDENT_AMBULATORY_CARE_PROVIDER_SITE_OTHER): Payer: BLUE CROSS/BLUE SHIELD | Admitting: Family Medicine

## 2015-03-01 ENCOUNTER — Encounter: Payer: Self-pay | Admitting: Family Medicine

## 2015-03-01 VITALS — BP 111/86 | HR 90 | Temp 98.6°F | Ht 65.0 in | Wt 170.0 lb

## 2015-03-01 DIAGNOSIS — M25511 Pain in right shoulder: Secondary | ICD-10-CM

## 2015-03-01 NOTE — Progress Notes (Signed)
Pre visit review using our clinic review tool, if applicable. No additional management support is needed unless otherwise documented below in the visit note. 

## 2015-03-01 NOTE — Progress Notes (Signed)
   Subjective:    Patient ID: Beverly Rogers, female    DOB: [birth year], 44 y.o.   MRN: [id number]  HPI Here for one year of right shoulder pain. No hx of trauma. This has been getting worse the past 2 months. Using Ibuprofen.    Review of Systems  Constitutional: Negative.   Musculoskeletal: Positive for arthralgias.       Objective:   Physical Exam  Constitutional: She appears well-developed and well-nourished.  Musculoskeletal:  The right shoulder is mildly tender anteriorly and posteriorly, there is mild crepitus present. ROM is full           Assessment & Plan:  Shoulder pain, refer to Orthopedics. Use moist heat and Diclofenac.

## 2015-06-07 ENCOUNTER — Encounter: Payer: Self-pay | Admitting: Family Medicine

## 2015-06-07 ENCOUNTER — Ambulatory Visit (INDEPENDENT_AMBULATORY_CARE_PROVIDER_SITE_OTHER): Payer: BLUE CROSS/BLUE SHIELD | Admitting: Family Medicine

## 2015-06-07 VITALS — BP 150/96 | HR 74 | Temp 98.6°F | Ht 65.0 in | Wt 182.0 lb

## 2015-06-07 DIAGNOSIS — J019 Acute sinusitis, unspecified: Secondary | ICD-10-CM

## 2015-06-07 MED ORDER — AZITHROMYCIN 250 MG PO TABS: ORAL_TABLET | ORAL | Status: DC

## 2015-06-07 NOTE — Progress Notes (Signed)
Pre visit review using our clinic review tool, if applicable. No additional management support is needed unless otherwise documented below in the visit note. 

## 2015-06-07 NOTE — Progress Notes (Signed)
   Subjective:    Patient ID: Beverly Rogers, female    DOB: 11/12/1970, 44 y.o.   MRN: [id number]  HPI Here for 5 days of stuffy head, pressure in the ears, PND , and a dry cough. No fever. On Sudafed.    Review of Systems  Constitutional: Negative.   HENT: Positive for congestion, postnasal drip and sinus pressure. Negative for sore throat.   Eyes: Negative.   Respiratory: Positive for cough.        Objective:   Physical Exam  Constitutional: She appears well-developed and well-nourished.  HENT:  Right Ear: External ear normal.  Left Ear: External ear normal.  Nose: Nose normal.  Mouth/Throat: Oropharynx is clear and moist.  Eyes: Conjunctivae are normal.  Neck: No thyromegaly present.  Pulmonary/Chest: Effort normal and breath sounds normal.  Lymphadenopathy:    She has no cervical adenopathy.          Assessment & Plan:  Sinusitis, treat with a Zpack and Mucinex D.

## 2015-07-20 ENCOUNTER — Other Ambulatory Visit: Payer: Self-pay | Admitting: Family Medicine

## 2015-07-20 NOTE — Telephone Encounter (Signed)
Ok to refill this medication? 

## 2015-09-10 ENCOUNTER — Ambulatory Visit (INDEPENDENT_AMBULATORY_CARE_PROVIDER_SITE_OTHER): Payer: BLUE CROSS/BLUE SHIELD | Admitting: Family Medicine

## 2015-09-10 ENCOUNTER — Encounter: Payer: Self-pay | Admitting: Family Medicine

## 2015-09-10 VITALS — BP 130/88 | HR 73 | Temp 98.3°F | Ht 65.0 in | Wt 169.0 lb

## 2015-09-10 DIAGNOSIS — R634 Abnormal weight loss: Secondary | ICD-10-CM | POA: Diagnosis not present

## 2015-09-10 DIAGNOSIS — R5383 Other fatigue: Secondary | ICD-10-CM

## 2015-09-10 LAB — VITAMIN D 25 HYDROXY (VIT D DEFICIENCY, FRACTURES): VITD: 11.59 ng/mL — ABNORMAL LOW (ref 30.00–100.00)

## 2015-09-10 LAB — LIPID PANEL
Cholesterol: 189 mg/dL (ref 0–200)
HDL: 49.4 mg/dL (ref >39.00)
LDL Cholesterol: 126 mg/dL — ABNORMAL HIGH (ref 0–99)
NonHDL: 139.52
Total CHOL/HDL Ratio: 4
Triglycerides: 69 mg/dL (ref 0.0–149.0)
VLDL: 13.8 mg/dL (ref 0.0–40.0)

## 2015-09-10 LAB — POC URINALSYSI DIPSTICK (AUTOMATED)
Bilirubin, UA: NEGATIVE
Glucose, UA: NEGATIVE
Ketones, UA: NEGATIVE
Nitrite, UA: NEGATIVE
Protein, UA: NEGATIVE
Spec Grav, UA: 1.02
Urobilinogen, UA: 0.2
pH, UA: 7

## 2015-09-10 LAB — HEPATIC FUNCTION PANEL
ALT: 11 U/L (ref 0–35)
AST: 16 U/L (ref 0–37)
Albumin: 4.4 g/dL (ref 3.5–5.2)
Alkaline Phosphatase: 59 U/L (ref 39–117)
Bilirubin, Direct: 0.1 mg/dL (ref 0.0–0.3)
Total Bilirubin: 0.6 mg/dL (ref 0.2–1.2)
Total Protein: 7.1 g/dL (ref 6.0–8.3)

## 2015-09-10 LAB — VITAMIN B12: Vitamin B-12: 275 pg/mL (ref 211–911)

## 2015-09-10 LAB — BASIC METABOLIC PANEL
BUN: 10 mg/dL (ref 6–23)
CO2: 25 mEq/L (ref 19–32)
Calcium: 9.7 mg/dL (ref 8.4–10.5)
Chloride: 110 mEq/L (ref 96–112)
Creatinine, Ser: 0.84 mg/dL (ref 0.40–1.20)
GFR: 93.6 mL/min (ref >60.00)
Glucose, Bld: 93 mg/dL (ref 70–99)
Potassium: 4.2 mEq/L (ref 3.5–5.1)
Sodium: 142 mEq/L (ref 135–145)

## 2015-09-10 LAB — CBC WITH DIFFERENTIAL/PLATELET
Basophils Absolute: 0 10*3/uL (ref 0.0–0.1)
Basophils Relative: 0.6 % (ref 0.0–3.0)
Eosinophils Absolute: 0.1 10*3/uL (ref 0.0–0.7)
Eosinophils Relative: 2.2 % (ref 0.0–5.0)
HCT: 34.1 % — ABNORMAL LOW (ref 36.0–46.0)
Hemoglobin: 11.2 g/dL — ABNORMAL LOW (ref 12.0–15.0)
Lymphocytes Relative: 49.6 % — ABNORMAL HIGH (ref 12.0–46.0)
Lymphs Abs: 2 10*3/uL (ref 0.7–4.0)
MCHC: 32.9 g/dL (ref 30.0–36.0)
MCV: 84.1 fl (ref 78.0–100.0)
Monocytes Absolute: 0.4 10*3/uL (ref 0.1–1.0)
Monocytes Relative: 10.1 % (ref 3.0–12.0)
Neutro Abs: 1.5 10*3/uL (ref 1.4–7.7)
Neutrophils Relative %: 37.5 % — ABNORMAL LOW (ref 43.0–77.0)
Platelets: 250 10*3/uL (ref 150.0–400.0)
RBC: 4.05 Mil/uL (ref 3.87–5.11)
RDW: 15 % (ref 11.5–15.5)
WBC: 4 10*3/uL (ref 4.0–10.5)

## 2015-09-10 LAB — T3, FREE: T3, Free: 3.3 pg/mL (ref 2.3–4.2)

## 2015-09-10 LAB — TSH: TSH: 1.38 u[IU]/mL (ref 0.35–4.50)

## 2015-09-10 LAB — T4, FREE: Free T4: 1.06 ng/dL (ref 0.60–1.60)

## 2015-09-10 NOTE — Progress Notes (Signed)
   Subjective:    Patient ID: Beverly Rogers, female    DOB: 09-10-1970, 45 y.o.   MRN: [id number]  HPI Here for one month of intermittent symptoms including fatigue, lightheadedness, feeling cold all the time, and trouble sleeping. She has lost some weight. No change in diet or meds.    Review of Systems  Constitutional: Positive for fatigue and unexpected weight change. Negative for activity change and appetite change.  Eyes: Negative.   Respiratory: Negative.   Cardiovascular: Negative.   Gastrointestinal: Negative.   Endocrine: Positive for cold intolerance. Negative for heat intolerance, polydipsia, polyphagia and polyuria.  Genitourinary: Negative.   Hematological: Negative.        Objective:   Physical Exam  Constitutional: She is oriented to person, place, and time. She appears well-developed and well-nourished.  HENT:  Right Ear: External ear normal.  Left Ear: External ear normal.  Nose: Nose normal.  Mouth/Throat: Oropharynx is clear and moist.  Eyes: Conjunctivae are normal.  Neck: No thyromegaly present.  Cardiovascular: Normal rate, regular rhythm, normal heart sounds and intact distal pulses.   Pulmonary/Chest: Effort normal and breath sounds normal.  Abdominal: Soft. Bowel sounds are normal. She exhibits no distension and no mass. There is no tenderness. There is no rebound and no guarding.  Musculoskeletal: She exhibits no edema.  Lymphadenopathy:    She has no cervical adenopathy.  Neurological: She is alert and oriented to person, place, and time.  Skin: No rash noted.          Assessment & Plan:  Fatigue and weight loss. Get labs today.

## 2015-09-10 NOTE — Progress Notes (Signed)
Pre visit review using our clinic review tool, if applicable. No additional management support is needed unless otherwise documented below in the visit note. 

## 2016-01-24 ENCOUNTER — Telehealth: Payer: Self-pay | Admitting: Family Medicine

## 2016-01-24 NOTE — Telephone Encounter (Signed)
Refill request for Kenalog.

## 2016-01-25 NOTE — Telephone Encounter (Signed)
Can we refill this? 

## 2016-01-25 NOTE — Telephone Encounter (Signed)
Yes, call in 30 gm with 5 rf

## 2016-01-26 MED ORDER — TRIAMCINOLONE ACETONIDE 0.1 % EX CREA: 1.0000 "application " | TOPICAL_CREAM | Freq: Three times a day (TID) | CUTANEOUS | Status: DC

## 2016-01-26 NOTE — Telephone Encounter (Signed)
Refill sent to pharmacy.   

## 2016-03-04 ENCOUNTER — Other Ambulatory Visit: Payer: Self-pay | Admitting: Family Medicine

## 2016-03-20 ENCOUNTER — Other Ambulatory Visit: Payer: Self-pay | Admitting: Unknown Physician Specialty

## 2016-03-20 DIAGNOSIS — Z1231 Encounter for screening mammogram for malignant neoplasm of breast: Secondary | ICD-10-CM

## 2016-03-27 ENCOUNTER — Telehealth: Payer: Self-pay | Admitting: Family Medicine

## 2016-03-27 MED ORDER — LORAZEPAM 0.5 MG PO TABS
0.5000 mg | ORAL_TABLET | Freq: Three times a day (TID) | ORAL | 2 refills | Status: DC | PRN
Start: 2016-03-27 — End: 2017-04-17

## 2016-03-27 NOTE — Telephone Encounter (Signed)
She asks for help with occasional anxiety

## 2016-04-04 ENCOUNTER — Ambulatory Visit (INDEPENDENT_AMBULATORY_CARE_PROVIDER_SITE_OTHER): Payer: BLUE CROSS/BLUE SHIELD | Admitting: Family Medicine

## 2016-04-04 ENCOUNTER — Encounter: Payer: Self-pay | Admitting: Family Medicine

## 2016-04-04 VITALS — BP 150/98 | HR 68 | Temp 98.7°F | Ht 65.0 in | Wt 186.0 lb

## 2016-04-04 DIAGNOSIS — J019 Acute sinusitis, unspecified: Secondary | ICD-10-CM | POA: Diagnosis not present

## 2016-04-04 MED ORDER — AZITHROMYCIN 250 MG PO TABS: ORAL_TABLET | ORAL | 0 refills | Status: DC

## 2016-04-04 MED ORDER — HYDROCODONE-HOMATROPINE 5-1.5 MG/5ML PO SYRP: 5.0000 mL | ORAL_SOLUTION | ORAL | 0 refills | Status: DC | PRN

## 2016-04-04 NOTE — Progress Notes (Signed)
Pre visit review using our clinic review tool, if applicable. No additional management support is needed unless otherwise documented below in the visit note. 

## 2016-04-04 NOTE — Progress Notes (Signed)
   Subjective:    Patient ID: Beverly Rogers, female    DOB: 23-Jul-1970, 45 y.o.   MRN: [id number]  HPI Here for 5 days of stuffy head, PND, and coughing up green sputum. No fever. On Mucinex D.    Review of Systems  Constitutional: Negative.   HENT: Positive for congestion, postnasal drip, sinus pressure and sore throat.   Eyes: Negative.   Respiratory: Positive for cough.        Objective:   Physical Exam  Constitutional: She appears well-developed and well-nourished.  HENT:  Right Ear: External ear normal.  Left Ear: External ear normal.  Nose: Nose normal.  Mouth/Throat: Oropharynx is clear and moist.  Eyes: Conjunctivae are normal.  Neck: No thyromegaly present.  Pulmonary/Chest: Effort normal and breath sounds normal. No respiratory distress. She has no wheezes. She has no rales.  Lymphadenopathy:    She has no cervical adenopathy.          Assessment & Plan:  Sinusitis, treat with a Zpack. Written out of work today and tomorrow.  Laurey Morale, MD

## 2016-04-13 ENCOUNTER — Ambulatory Visit
Admission: RE | Admit: 2016-04-13 | Discharge: 2016-04-13 | Disposition: A | Discharge status: Home / Self Care | Payer: BLUE CROSS/BLUE SHIELD | Source: Ambulatory Visit | Attending: Unknown Physician Specialty | Admitting: Unknown Physician Specialty

## 2016-04-13 DIAGNOSIS — Z1231 Encounter for screening mammogram for malignant neoplasm of breast: Secondary | ICD-10-CM

## 2016-05-09 ENCOUNTER — Other Ambulatory Visit: Payer: Self-pay | Admitting: Family Medicine

## 2016-05-19 ENCOUNTER — Other Ambulatory Visit: Payer: Self-pay | Admitting: Family Medicine

## 2016-05-19 MED ORDER — FUROSEMIDE 40 MG PO TABS: 40.0000 mg | ORAL_TABLET | Freq: Every day | ORAL | 1 refills | Status: DC

## 2016-05-19 NOTE — Telephone Encounter (Signed)
Refill request for Furosemide 40 mg and a 90 day supply to [facility].

## 2016-05-19 NOTE — Telephone Encounter (Signed)
I sent script e-scribe to [facility].

## 2016-11-02 ENCOUNTER — Other Ambulatory Visit: Payer: Self-pay | Admitting: Family Medicine

## 2016-11-02 MED ORDER — FUROSEMIDE 40 MG PO TABS: 40.0000 mg | ORAL_TABLET | Freq: Every day | ORAL | 1 refills | Status: DC

## 2016-11-02 NOTE — Telephone Encounter (Signed)
Refill request for Lasix 40 take 1 po qd and a 90 day supply to [facility].

## 2016-11-02 NOTE — Telephone Encounter (Signed)
I did sent script e-scribe to [facility] and for a 90 day supply.

## 2016-11-06 ENCOUNTER — Ambulatory Visit (INDEPENDENT_AMBULATORY_CARE_PROVIDER_SITE_OTHER): Payer: BLUE CROSS/BLUE SHIELD | Admitting: Family Medicine

## 2016-11-06 ENCOUNTER — Encounter: Payer: Self-pay | Admitting: Family Medicine

## 2016-11-06 VITALS — BP 134/98 | HR 64 | Temp 98.4°F | Ht 65.0 in | Wt 168.0 lb

## 2016-11-06 DIAGNOSIS — L309 Dermatitis, unspecified: Secondary | ICD-10-CM | POA: Diagnosis not present

## 2016-11-06 DIAGNOSIS — B372 Candidiasis of skin and nail: Secondary | ICD-10-CM | POA: Diagnosis not present

## 2016-11-06 MED ORDER — KETOCONAZOLE 2 % EX CREA: 1.0000 "application " | TOPICAL_CREAM | Freq: Two times a day (BID) | CUTANEOUS | 5 refills | Status: AC | PRN

## 2016-11-06 MED ORDER — HALOBETASOL PROPIONATE 0.05 % EX CREA: TOPICAL_CREAM | Freq: Two times a day (BID) | CUTANEOUS | 5 refills | Status: AC | PRN

## 2016-11-06 NOTE — Progress Notes (Signed)
   Subjective:    Patient ID: Beverly Rogers, female    DOB: 02/07/1971, 46 y.o.   MRN: [id number]  HPI Here for advice about several rashes. She has a hx of eczema on the trunk and she has used Triamcinolone with success in the past. However it no longer works as well the ras on her chest and neck itches. Also she has had a rash under both breasts for about a month that itches and burns. She has tried Eucerin lotion with mild benefit.    Review of Systems  Constitutional: Negative.   Respiratory: Negative.   Cardiovascular: Negative.   Skin: Positive for rash.       Objective:   Physical Exam  Constitutional: She appears well-developed and well-nourished.  Cardiovascular: Normal rate, regular rhythm, normal heart sounds and intact distal pulses.   Pulmonary/Chest: Effort normal and breath sounds normal.  Skin:  She has a red macular rash on the anterior neck and upper chest. She also has a dark brown macular rash under both breasts          Assessment & Plan:  The chest rash is eczema and she will try Halobetasol cream for this. The rash under the breasts is fungal and she will try Ketoconazole cream for this.  Alysia Penna, MD

## 2016-11-06 NOTE — Progress Notes (Signed)
Pre visit review using our clinic review tool, if applicable. No additional management support is needed unless otherwise documented below in the visit note. 

## 2016-11-06 NOTE — Patient Instructions (Signed)
WE NOW OFFER   [facility] FAST TRACK!!!  SAME DAY Appointments for ACUTE CARE  Such as: Sprains, Injuries, cuts, abrasions, rashes, muscle pain, joint pain, back pain Colds, flu, sore throats, headache, allergies, cough, fever  Ear pain, sinus and eye infections Abdominal pain, nausea, vomiting, diarrhea, upset stomach Animal/insect bites  3 Easy Ways to Schedule: Walk-In Scheduling Call in scheduling Mychart Sign-up: [email or web address]         

## 2016-12-18 ENCOUNTER — Ambulatory Visit (INDEPENDENT_AMBULATORY_CARE_PROVIDER_SITE_OTHER): Payer: BLUE CROSS/BLUE SHIELD | Admitting: Family Medicine

## 2016-12-18 ENCOUNTER — Encounter: Payer: Self-pay | Admitting: Family Medicine

## 2016-12-18 VITALS — BP 134/90 | HR 67 | Temp 98.5°F | Ht 65.0 in | Wt 167.0 lb

## 2016-12-18 DIAGNOSIS — T161XXA Foreign body in right ear, initial encounter: Secondary | ICD-10-CM | POA: Diagnosis not present

## 2016-12-18 NOTE — Progress Notes (Signed)
   Subjective:    Patient ID: Beverly Rogers, female    DOB: 09-May-1971, 46 y.o.   MRN: [id number]  HPI Here for 3 days of a mild pain in the right ear. No sinus congestion or ST. No trouble hearing.    Review of Systems  Constitutional: Negative.   HENT: Positive for ear pain. Negative for congestion, ear discharge, hearing loss, postnasal drip, sinus pain, sinus pressure and sore throat.   Eyes: Negative.   Respiratory: Negative.        Objective:   Physical Exam  Constitutional: She appears well-developed and well-nourished.  HENT:  Left Ear: External ear normal.  Nose: Nose normal.  Mouth/Throat: Oropharynx is clear and moist.  The right ear canal has a long thin object in it that looks like a thread. The TM is clear, there is no cerumen  Eyes: Conjunctivae are normal.  Neck: No thyromegaly present.  Pulmonary/Chest: Effort normal and breath sounds normal.  Lymphadenopathy:    She has no cervical adenopathy.          Assessment & Plan:  We were able to irrigate the ear clear and a small hair came out. I believe this was irritating her ear canal. Recheck prn.  Alysia Penna, MD

## 2016-12-18 NOTE — Patient Instructions (Signed)
WE NOW OFFER   [facility] FAST TRACK!!!  SAME DAY Appointments for ACUTE CARE  Such as: Sprains, Injuries, cuts, abrasions, rashes, muscle pain, joint pain, back pain Colds, flu, sore throats, headache, allergies, cough, fever  Ear pain, sinus and eye infections Abdominal pain, nausea, vomiting, diarrhea, upset stomach Animal/insect bites  3 Easy Ways to Schedule: Walk-In Scheduling Call in scheduling Mychart Sign-up: [email or web address]         

## 2017-04-12 ENCOUNTER — Ambulatory Visit (INDEPENDENT_AMBULATORY_CARE_PROVIDER_SITE_OTHER): Payer: BLUE CROSS/BLUE SHIELD | Admitting: Family Medicine

## 2017-04-12 ENCOUNTER — Encounter: Payer: Self-pay | Admitting: Family Medicine

## 2017-04-12 VITALS — BP 132/84 | Temp 97.9°F | Ht 65.0 in | Wt 171.0 lb

## 2017-04-12 DIAGNOSIS — N390 Urinary tract infection, site not specified: Secondary | ICD-10-CM

## 2017-04-12 LAB — POC URINALSYSI DIPSTICK (AUTOMATED)
Bilirubin, UA: NEGATIVE
Glucose, UA: NEGATIVE
Ketones, UA: NEGATIVE
Nitrite, UA: NEGATIVE
Spec Grav, UA: 1.025 (ref 1.010–1.025)
Urobilinogen, UA: 0.2 E.U./dL
pH, UA: 6 (ref 5.0–8.0)

## 2017-04-12 MED ORDER — NITROFURANTOIN MONOHYD MACRO 100 MG PO CAPS: 100.0000 mg | ORAL_CAPSULE | Freq: Two times a day (BID) | ORAL | 0 refills | Status: DC

## 2017-04-12 MED ORDER — PROMETHAZINE HCL 25 MG PO TABS: 25.0000 mg | ORAL_TABLET | ORAL | 2 refills | Status: DC | PRN

## 2017-04-12 NOTE — Progress Notes (Signed)
   Subjective:    Patient ID: Beverly Rogers, female    DOB: August 09, 1970, 46 y.o.   MRN: [id number]  HPI Here for 2 weeks of feeling bad with fatigue and mild nausea. Then 3 days ago she developed urinary urgency and burning. No back pain or fever. Drinking lots of water.    Review of Systems  Constitutional: Negative.   Respiratory: Negative.   Cardiovascular: Negative.   Gastrointestinal: Negative.   Genitourinary: Positive for dysuria, frequency and urgency. Negative for flank pain and hematuria.       Objective:   Physical Exam  Constitutional: She appears well-developed and well-nourished.  Cardiovascular: Normal rate, regular rhythm, normal heart sounds and intact distal pulses.   Pulmonary/Chest: Effort normal and breath sounds normal. No respiratory distress. She has no wheezes. She has no rales.  Abdominal: Soft. Bowel sounds are normal. She exhibits no distension and no mass. There is no tenderness. There is no rebound and no guarding.          Assessment & Plan:  UTI, treat with Macrobid. Culture the sample.  Alysia Penna, MD

## 2017-04-12 NOTE — Addendum Note (Signed)
Addended by: Aggie Hacker A on: 04/12/2017 10:00 AM   Modules accepted: Orders

## 2017-04-12 NOTE — Patient Instructions (Signed)
WE NOW OFFER   [facility] FAST TRACK!!!  SAME DAY Appointments for ACUTE CARE  Such as: Sprains, Injuries, cuts, abrasions, rashes, muscle pain, joint pain, back pain Colds, flu, sore throats, headache, allergies, cough, fever  Ear pain, sinus and eye infections Abdominal pain, nausea, vomiting, diarrhea, upset stomach Animal/insect bites  3 Easy Ways to Schedule: Walk-In Scheduling Call in scheduling Mychart Sign-up: [email or web address]         

## 2017-04-13 ENCOUNTER — Telehealth: Payer: Self-pay | Admitting: *Deleted

## 2017-04-13 MED ORDER — FLUCONAZOLE 150 MG PO TABS
150.0000 mg | ORAL_TABLET | Freq: Once | ORAL | 5 refills | Status: AC
Start: 2017-04-13 — End: 2017-04-13

## 2017-04-13 NOTE — Telephone Encounter (Signed)
Patient called stating Dr Sarajane Jews has prescribed her macrobid and she would like a diflucan just in case anything happens over the weekend and she is also requesting ativan to be refilled. Please advise [phone]

## 2017-04-13 NOTE — Telephone Encounter (Signed)
Call in Diflucan 150 mg #1 with 5 rf 

## 2017-04-13 NOTE — Telephone Encounter (Signed)
[facility] and spoke with [facility]. She needs refill on Ativan also.

## 2017-04-15 LAB — URINE CULTURE
MICRO NUMBER:: 81104224
SPECIMEN QUALITY:: ADEQUATE

## 2017-04-16 NOTE — Telephone Encounter (Signed)
Call in [place] #60 with 2 rf

## 2017-04-17 MED ORDER — LORAZEPAM 0.5 MG PO TABS: 0.5000 mg | ORAL_TABLET | Freq: Three times a day (TID) | ORAL | 2 refills | Status: DC | PRN

## 2017-04-17 NOTE — Telephone Encounter (Signed)
[facility]

## 2017-04-17 NOTE — Addendum Note (Signed)
Addended by: Aggie Hacker A on: 04/17/2017 12:56 PM   Modules accepted: Orders

## 2017-04-27 ENCOUNTER — Other Ambulatory Visit: Payer: Self-pay | Admitting: Family Medicine

## 2017-07-31 ENCOUNTER — Telehealth: Payer: Self-pay | Admitting: Family Medicine

## 2017-07-31 NOTE — Telephone Encounter (Signed)
Does pt need to have labs done before refilling the lasix? The last bmet was 09/315. LOV 04/12/17.

## 2017-07-31 NOTE — Telephone Encounter (Signed)
Copied from [place] [id number]. Topic: Quick Communication - Rx Refill/Question >> Jul 31, 2017 12:41 PM Waylan Rocher, Lumin L wrote: Medication: furosemide (LASIX) 40 MG tablet  90tabs Has the patient contacted their pharmacy? Yes.   (Agent: If no, request that the patient contact the pharmacy for the refill.) Preferred Pharmacy (with phone number or street name): [id number] [place], [place]: Please be advised that RX refills may take up to 3 business days. We ask that you follow-up with your pharmacy. Pharm faxed for refill on 01/17 and 01/18.

## 2017-07-31 NOTE — Telephone Encounter (Signed)
Sent to PCP ?

## 2017-08-01 ENCOUNTER — Other Ambulatory Visit: Payer: Self-pay | Admitting: Family Medicine

## 2017-08-02 ENCOUNTER — Other Ambulatory Visit: Payer: Self-pay

## 2017-08-02 MED ORDER — FUROSEMIDE 40 MG PO TABS: 40.0000 mg | ORAL_TABLET | Freq: Every day | ORAL | 3 refills | Status: DC

## 2017-08-02 NOTE — Telephone Encounter (Signed)
Rx has been sent. Called pt. Pt advised and voiced understanding. 

## 2017-08-02 NOTE — Telephone Encounter (Signed)
Call in #90 with 3 rf  

## 2017-10-05 DIAGNOSIS — N760 Acute vaginitis: Secondary | ICD-10-CM | POA: Diagnosis not present

## 2017-10-05 DIAGNOSIS — B9689 Other specified bacterial agents as the cause of diseases classified elsewhere: Secondary | ICD-10-CM | POA: Diagnosis not present

## 2017-11-05 ENCOUNTER — Encounter: Payer: Self-pay | Admitting: Family Medicine

## 2017-11-05 ENCOUNTER — Ambulatory Visit: Payer: BLUE CROSS/BLUE SHIELD | Admitting: Family Medicine

## 2017-11-05 VITALS — BP 150/100 | HR 68 | Temp 98.5°F | Ht 65.0 in | Wt 183.0 lb

## 2017-11-05 DIAGNOSIS — I10 Essential (primary) hypertension: Secondary | ICD-10-CM | POA: Diagnosis not present

## 2017-11-05 LAB — POC URINALSYSI DIPSTICK (AUTOMATED)
Bilirubin, UA: NEGATIVE
Blood, UA: NEGATIVE
Glucose, UA: NEGATIVE
Leukocytes, UA: NEGATIVE
Nitrite, UA: NEGATIVE
Protein, UA: NEGATIVE
Spec Grav, UA: >=1.03 — AB (ref 1.010–1.025)
Urobilinogen, UA: 0.2 E.U./dL
pH, UA: 6 (ref 5.0–8.0)

## 2017-11-05 MED ORDER — LISINOPRIL-HYDROCHLOROTHIAZIDE 10-12.5 MG PO TABS: 1.0000 | ORAL_TABLET | Freq: Every day | ORAL | 3 refills | Status: DC

## 2017-11-05 MED ORDER — ESTRADIOL 0.025 MG/24HR TD PTWK: 0.0250 mg | MEDICATED_PATCH | TRANSDERMAL | 12 refills | Status: AC

## 2017-11-06 ENCOUNTER — Encounter: Payer: Self-pay | Admitting: Family Medicine

## 2017-11-06 LAB — CBC WITH DIFFERENTIAL/PLATELET
Basophils Absolute: 0 10*3/uL (ref 0.0–0.1)
Basophils Relative: 0.8 % (ref 0.0–3.0)
Eosinophils Absolute: 0.2 10*3/uL (ref 0.0–0.7)
Eosinophils Relative: 2.7 % (ref 0.0–5.0)
HCT: 37.3 % (ref 36.0–46.0)
Hemoglobin: 12.2 g/dL (ref 12.0–15.0)
Lymphocytes Relative: 44.3 % (ref 12.0–46.0)
Lymphs Abs: 2.4 10*3/uL (ref 0.7–4.0)
MCHC: 32.8 g/dL (ref 30.0–36.0)
MCV: 83.5 fl (ref 78.0–100.0)
Monocytes Absolute: 0.7 10*3/uL (ref 0.1–1.0)
Monocytes Relative: 13.3 % — ABNORMAL HIGH (ref 3.0–12.0)
Neutro Abs: 2.1 10*3/uL (ref 1.4–7.7)
Neutrophils Relative %: 38.9 % — ABNORMAL LOW (ref 43.0–77.0)
Platelets: 284 10*3/uL (ref 150.0–400.0)
RBC: 4.47 Mil/uL (ref 3.87–5.11)
RDW: 16.6 % — ABNORMAL HIGH (ref 11.5–15.5)
WBC: 5.5 10*3/uL (ref 4.0–10.5)

## 2017-11-06 LAB — BASIC METABOLIC PANEL
BUN: 10 mg/dL (ref 6–23)
CO2: 27 mEq/L (ref 19–32)
Calcium: 9.1 mg/dL (ref 8.4–10.5)
Chloride: 102 mEq/L (ref 96–112)
Creatinine, Ser: 0.7 mg/dL (ref 0.40–1.20)
GFR: 114.46 mL/min (ref >60.00)
Glucose, Bld: 79 mg/dL (ref 70–99)
Potassium: 3.8 mEq/L (ref 3.5–5.1)
Sodium: 139 mEq/L (ref 135–145)

## 2017-11-06 LAB — LIPID PANEL
Cholesterol: 195 mg/dL (ref 0–200)
HDL: 49.8 mg/dL (ref >39.00)
LDL Cholesterol: 131 mg/dL — ABNORMAL HIGH (ref 0–99)
NonHDL: 145.39
Total CHOL/HDL Ratio: 4
Triglycerides: 72 mg/dL (ref 0.0–149.0)
VLDL: 14.4 mg/dL (ref 0.0–40.0)

## 2017-11-06 LAB — VITAMIN D 25 HYDROXY (VIT D DEFICIENCY, FRACTURES): VITD: 28.31 ng/mL — ABNORMAL LOW (ref 30.00–100.00)

## 2017-11-06 LAB — VITAMIN B12: Vitamin B-12: 253 pg/mL (ref 211–911)

## 2017-11-06 LAB — TSH: TSH: 0.82 u[IU]/mL (ref 0.35–4.50)

## 2017-11-06 NOTE — Progress Notes (Signed)
   Subjective:    Patient ID: CATHYE KREITER, female    DOB: [birth year], 47 y.o.   MRN: [id number]  HPI Here for 2 weeks of fatigue and headaches. She feels like sleeping all the time, and this is very unusual for her. No chest pain or SOB. She has been checking her BP at home, and it has been in the range of 150s or 160s over 100s the whole time. She has gained a few pounds and her feet have been swelling.    Review of Systems  Constitutional: Positive for fatigue.  Respiratory: Negative.   Cardiovascular: Positive for leg swelling. Negative for chest pain and palpitations.  Gastrointestinal: Negative.   Genitourinary: Negative.   Neurological: Negative.        Objective:   Physical Exam  Constitutional: She appears well-developed and well-nourished.  Neck: Neck supple. No thyromegaly present.  Cardiovascular: Normal rate, regular rhythm, normal heart sounds and intact distal pulses.  Pulmonary/Chest: Effort normal and breath sounds normal.  Musculoskeletal:  1+ edema in both ankles   Lymphadenopathy:    She has no cervical adenopathy.          Assessment & Plan:  New onset HTN. She will continue to use Lasix as needed. Start on Lisinopril HCT 10-12.5 daily. Get labs. Recheck in 3 weeks.  Alysia Penna, MD

## 2017-11-13 ENCOUNTER — Telehealth: Payer: Self-pay | Admitting: Family Medicine

## 2017-11-13 NOTE — Telephone Encounter (Signed)
Copied from [place] [id number]. Topic: Quick Communication - See Telephone Encounter >> Nov 13, 2017  1:33 PM Boyd Kerbs wrote: CRM for notification. See Telephone encounter for: 11/13/17.  Pt. Is calling about lab results from last week.  Has not heard anything.

## 2017-11-13 NOTE — Telephone Encounter (Signed)
Called patient and left detailed message (per DPR) w/ results and instructions to return call if questions.

## 2018-01-04 ENCOUNTER — Encounter: Payer: Self-pay | Admitting: Family Medicine

## 2018-01-04 ENCOUNTER — Ambulatory Visit (INDEPENDENT_AMBULATORY_CARE_PROVIDER_SITE_OTHER): Payer: BLUE CROSS/BLUE SHIELD | Admitting: Family Medicine

## 2018-01-04 VITALS — BP 148/92 | HR 84 | Temp 98.5°F | Ht 65.0 in | Wt 184.6 lb

## 2018-01-04 DIAGNOSIS — M542 Cervicalgia: Secondary | ICD-10-CM | POA: Diagnosis not present

## 2018-01-04 DIAGNOSIS — G43909 Migraine, unspecified, not intractable, without status migrainosus: Secondary | ICD-10-CM | POA: Diagnosis not present

## 2018-01-04 MED ORDER — METHYLPREDNISOLONE 4 MG PO TBPK: ORAL_TABLET | ORAL | 0 refills | Status: DC

## 2018-01-04 MED ORDER — CYCLOBENZAPRINE HCL 10 MG PO TABS: 10.0000 mg | ORAL_TABLET | Freq: Three times a day (TID) | ORAL | 1 refills | Status: DC | PRN

## 2018-01-04 NOTE — Progress Notes (Signed)
   Subjective:    Patient ID: Beverly Rogers, female    DOB: 04-12-1971, 47 y.o.   MRN: [id number]  HPI Here for 3 days of neck pain and she also has a migraine today. The neck pain began as she was leaving work to get into her car. She felt a sudden tightness and sharp pain in the left neck and upper back area. This has continued ever since. The pain radiates down the right arm. Using heat and Ibuprofen. Then this morning she woke up with the migraine. Maxalt has not helped.    Review of Systems  Constitutional: Negative.   Respiratory: Negative.   Cardiovascular: Negative.   Musculoskeletal: Positive for neck pain and neck stiffness.  Neurological: Positive for headaches.       Objective:   Physical Exam  Constitutional: She is oriented to person, place, and time.  Alert but in pain  Eyes: Pupils are equal, round, and reactive to light. EOM are normal.  Neck: No thyromegaly present.  The left neck and trapezius are swollen and tender. ROM is quite limited.   Musculoskeletal:  Tender in the left upper back  Lymphadenopathy:    She has no cervical adenopathy.  Neurological: She is alert and oriented to person, place, and time. No cranial nerve deficit. She exhibits normal muscle tone. Coordination normal.          Assessment & Plan:  Neck pain with spasms. Use Flexeril and a Medrol dose pack. Hopefully this will help the migraine also. Written out of work today.  Alysia Penna, MD

## 2018-01-24 ENCOUNTER — Encounter: Payer: Self-pay | Admitting: Family Medicine

## 2018-01-24 ENCOUNTER — Ambulatory Visit: Payer: BLUE CROSS/BLUE SHIELD | Admitting: Family Medicine

## 2018-01-24 VITALS — BP 140/90 | HR 82 | Temp 98.0°F | Ht 65.0 in | Wt 184.2 lb

## 2018-01-24 DIAGNOSIS — M545 Low back pain, unspecified: Secondary | ICD-10-CM

## 2018-01-24 LAB — POCT URINALYSIS DIPSTICK
Bilirubin, UA: NEGATIVE
Blood, UA: NEGATIVE
Glucose, UA: NEGATIVE
Ketones, UA: NEGATIVE
Leukocytes, UA: NEGATIVE
Nitrite, UA: NEGATIVE
Protein, UA: POSITIVE — AB
Spec Grav, UA: >=1.03 — AB (ref 1.010–1.025)
Urobilinogen, UA: 0.2 E.U./dL
pH, UA: 6 (ref 5.0–8.0)

## 2018-01-24 NOTE — Progress Notes (Signed)
   Subjective:    Patient ID: Beverly Rogers, female    DOB: 05-26-1971, 47 y.o.   MRN: [id number]  HPI Here for intermittent pain in the left lower back for the past 3 weeks. No urinary burning or urgency. We saw her on 01-04-18 for neck pain. This resolved and now she has pain in the back. The pain does not radiate to the legs. Ibuprofen and heat help.    Review of Systems  Constitutional: Negative.   Respiratory: Negative.   Cardiovascular: Negative.   Genitourinary: Negative.   Musculoskeletal: Positive for back pain.       Objective:   Physical Exam  Constitutional: She appears well-developed and well-nourished.  Cardiovascular: Normal rate, regular rhythm, normal heart sounds and intact distal pulses.  Pulmonary/Chest: Effort normal and breath sounds normal.  Musculoskeletal:  She is tender over the spine in the area of L3 to L5. There is tenderness with muscle spasms to the left of this area. ROM is full. Negative SLR.           Assessment & Plan:  Low back pain. She will take 2 Aleve tablets bid and add Flexeril tid. Refer to PT.  Alysia Penna, MD

## 2018-01-24 NOTE — Addendum Note (Signed)
Addended by: Myriam Forehand on: 01/24/2018 11:33 AM   Modules accepted: Orders

## 2018-04-17 ENCOUNTER — Ambulatory Visit: Payer: BLUE CROSS/BLUE SHIELD | Admitting: Family Medicine

## 2018-04-17 ENCOUNTER — Encounter: Payer: Self-pay | Admitting: Family Medicine

## 2018-04-17 VITALS — BP 140/88 | HR 67 | Temp 98.2°F | Wt 190.4 lb

## 2018-04-17 DIAGNOSIS — J019 Acute sinusitis, unspecified: Secondary | ICD-10-CM | POA: Diagnosis not present

## 2018-04-17 MED ORDER — AZITHROMYCIN 250 MG PO TABS: ORAL_TABLET | ORAL | 0 refills | Status: DC

## 2018-04-17 MED ORDER — HYDROCODONE-HOMATROPINE 5-1.5 MG/5ML PO SYRP: 5.0000 mL | ORAL_SOLUTION | ORAL | 0 refills | Status: DC | PRN

## 2018-04-17 NOTE — Progress Notes (Signed)
   Subjective:    Patient ID: Beverly Rogers, female    DOB: Aug 17, 1970, 47 y.o.   MRN: [id number]  HPI Here for 3 days of sinus pressure, PND, ST , and a dry cough. No fever. Using Dayquil and Nyquil.    Review of Systems  Constitutional: Negative.   HENT: Positive for congestion, postnasal drip, sinus pressure, sinus pain and sore throat.   Eyes: Negative.   Respiratory: Positive for cough.        Objective:   Physical Exam  Constitutional: She appears well-developed and well-nourished.  HENT:  Right Ear: External ear normal.  Left Ear: External ear normal.  Nose: Nose normal.  Mouth/Throat: Oropharynx is clear and moist.  Eyes: Conjunctivae are normal.  Neck: No thyromegaly present.  Pulmonary/Chest: Effort normal and breath sounds normal. No stridor. No respiratory distress. She has no wheezes. She has no rales.  Lymphadenopathy:    She has no cervical adenopathy.          Assessment & Plan:  Sinusitis, treat with a Zpack.  Alysia Penna, MD

## 2018-04-18 DIAGNOSIS — Z1239 Encounter for other screening for malignant neoplasm of breast: Secondary | ICD-10-CM | POA: Diagnosis not present

## 2018-04-18 DIAGNOSIS — Z1231 Encounter for screening mammogram for malignant neoplasm of breast: Secondary | ICD-10-CM | POA: Diagnosis not present

## 2018-04-19 ENCOUNTER — Telehealth: Payer: Self-pay | Admitting: Family Medicine

## 2018-04-19 MED ORDER — FLUCONAZOLE 150 MG PO TABS: 150.0000 mg | ORAL_TABLET | Freq: Once | ORAL | 5 refills | Status: AC

## 2018-04-19 NOTE — Telephone Encounter (Signed)
Call in Diflucan 150 mg to take one now, #1 with 5 rf

## 2018-04-19 NOTE — Telephone Encounter (Signed)
I have sent the diflucan to her pharmacy.  I called to advise the pt of this but got her VM.  Advised the pt to call the office.

## 2018-04-19 NOTE — Telephone Encounter (Signed)
Dr. Sarajane Jews please advise on medication to send in for yeast infection after starting on abx.  thanks

## 2018-04-19 NOTE — Telephone Encounter (Signed)
Copied from [place] [id number]. Topic: Quick Communication - See Telephone Encounter >> Apr 19, 2018  8:20 AM Conception Chancy, NT wrote: CRM for notification. See Telephone encounter for: 04/19/18.  Patient was seen on 04/17/18 and was given an antibiotic. She is requesting a medication for a yeast infection. Would like for Dr. Sarajane Jews nurse to contact her.  [id number] [address], [place] [address] Phone: [phone] Fax: [phone]

## 2018-04-22 NOTE — Telephone Encounter (Signed)
Called and spoke with pt and she stated that she did get the diflucan on Friday.  Noting further is needed.

## 2018-04-24 ENCOUNTER — Ambulatory Visit: Payer: BLUE CROSS/BLUE SHIELD

## 2018-04-24 DIAGNOSIS — Z01419 Encounter for gynecological examination (general) (routine) without abnormal findings: Secondary | ICD-10-CM | POA: Diagnosis not present

## 2018-04-24 DIAGNOSIS — F5101 Primary insomnia: Secondary | ICD-10-CM | POA: Diagnosis not present

## 2018-04-24 DIAGNOSIS — R635 Abnormal weight gain: Secondary | ICD-10-CM | POA: Diagnosis not present

## 2018-04-24 DIAGNOSIS — N951 Menopausal and female climacteric states: Secondary | ICD-10-CM | POA: Diagnosis not present

## 2018-05-14 ENCOUNTER — Encounter: Payer: Self-pay | Admitting: Family Medicine

## 2018-05-14 ENCOUNTER — Ambulatory Visit: Payer: BLUE CROSS/BLUE SHIELD | Admitting: Family Medicine

## 2018-05-14 VITALS — BP 136/78 | HR 96 | Temp 98.3°F | Wt 189.1 lb

## 2018-05-14 DIAGNOSIS — G43909 Migraine, unspecified, not intractable, without status migrainosus: Secondary | ICD-10-CM | POA: Diagnosis not present

## 2018-05-14 MED ORDER — KETOROLAC TROMETHAMINE 60 MG/2ML IM SOLN
60.0000 mg | Freq: Once | INTRAMUSCULAR | Status: AC
  Administered 2018-05-14: 60 mg via INTRAMUSCULAR

## 2018-05-14 MED ORDER — RIZATRIPTAN BENZOATE 10 MG PO TABS: ORAL_TABLET | ORAL | 11 refills | Status: DC

## 2018-05-14 MED ORDER — TOPIRAMATE 100 MG PO TABS: 150.0000 mg | ORAL_TABLET | Freq: Every day | ORAL | 3 refills | Status: DC

## 2018-05-14 NOTE — Addendum Note (Signed)
Addended by: Elie Confer on: 05/14/2018 10:30 AM   Modules accepted: Orders

## 2018-05-14 NOTE — Progress Notes (Signed)
   Subjective:    Patient ID: Beverly Rogers, female    DOB: [birth year], 47 y.o.   MRN: [id number]  HPI Here for migraine headaches. She actually woke up with one this morning. She has a typical global headache with nausea and vomiting. She took a Maxalt with no relief. Her migraines are getting more frequent than ever, and now she is averaging 2 or 3 a week. Maxalt usually works well but not always.    Review of Systems  Constitutional: Negative.   Respiratory: Negative.   Cardiovascular: Negative.   Neurological: Positive for headaches.       Objective:   Physical Exam  Constitutional: She is oriented to person, place, and time.  In pain, alert  Eyes: Pupils are equal, round, and reactive to light. Conjunctivae and EOM are normal.  Cardiovascular: Normal rate, regular rhythm, normal heart sounds and intact distal pulses.  Pulmonary/Chest: Effort normal and breath sounds normal.  Neurological: She is alert and oriented to person, place, and time.          Assessment & Plan:  Migraines. She is given a shot of Toradol today. For prevention we will increase the Topamax to 150 mg daily. She used to see Dr. Domingo Cocking, but she has not seen him for over 2 years. We will fill out paperwork for intermittent FMLA.  Alysia Penna, MD

## 2018-05-17 ENCOUNTER — Ambulatory Visit: Payer: Self-pay | Admitting: *Deleted

## 2018-05-17 NOTE — Telephone Encounter (Signed)
Summary: topiramate (TOPAMAX) 100 MG tablet   Relation to pt: self  Call back number:[phone] (M) [id number] [place], [place] [phone] (Phone) [phone] (Fax)    Reason for call:  Patient was seen by Dr. Sarajane Jews 05/14/18 for migraines and topiramate (TOPAMAX) 100 MG tablet was prescribed. Patient states medication is making her "loopy" patient denied any other symptoms, patient inquiring if she should d/c medication. Patient scheduled follow up with PCP to discuss on Monday 05/20/18, please advise

## 2018-05-17 NOTE — Telephone Encounter (Signed)
  Reason for Disposition . Caller has URGENT medication question about med that PCP prescribed and triager unable to answer question    Patient is ok to wait for response- please leave message on VM if she does not answer.  Answer Assessment - Initial Assessment Questions 1. SYMPTOMS: "Do you have any symptoms?"     Feels "loopy" and anxious. Patient is still having migraines- not as severe 2. SEVERITY: If symptoms are present, ask "Are they mild, moderate or severe?"     Patient is functioning- but she is is having to think about what she is doing. Patient is still taking the medication- should she discontinue the medication. They did discuss another option if she could not tolerate this Rx. She does have appointment on Monday. Told patient she would hear back with instructions about medication today.  Protocols used: MEDICATION QUESTION CALL-A-AH

## 2018-05-17 NOTE — Telephone Encounter (Signed)
Please advise. Medication reaction, see initial message taken regarding Topamax making her feel "loopy"

## 2018-05-17 NOTE — Telephone Encounter (Signed)
Dr. Fry please advise. Thanks  

## 2018-05-20 ENCOUNTER — Ambulatory Visit: Payer: BLUE CROSS/BLUE SHIELD | Admitting: Family Medicine

## 2018-05-20 ENCOUNTER — Encounter: Payer: Self-pay | Admitting: Family Medicine

## 2018-05-20 VITALS — BP 136/80 | HR 61 | Temp 98.4°F | Wt 191.1 lb

## 2018-05-20 DIAGNOSIS — R519 Headache, unspecified: Secondary | ICD-10-CM

## 2018-05-20 DIAGNOSIS — R51 Headache: Secondary | ICD-10-CM

## 2018-05-20 MED ORDER — PROPRANOLOL HCL ER 60 MG PO CP24: 60.0000 mg | ORAL_CAPSULE | Freq: Every day | ORAL | 2 refills | Status: DC

## 2018-05-20 MED ORDER — PROMETHAZINE HCL 25 MG PO TABS: 25.0000 mg | ORAL_TABLET | ORAL | 5 refills | Status: DC | PRN

## 2018-05-20 MED ORDER — KETOROLAC TROMETHAMINE 60 MG/2ML IM SOLN
60.0000 mg | Freq: Once | INTRAMUSCULAR | Status: AC
  Administered 2018-05-20: 60 mg via INTRAMUSCULAR

## 2018-05-20 NOTE — Telephone Encounter (Signed)
Stop the Topamax. Instead she can try Propranolol LA 60 mg at bedtime, call in #30 with 2 rf

## 2018-05-20 NOTE — Progress Notes (Signed)
   Subjective:    Patient ID: ARAYLA KRUSCHKE, female    DOB: Sep 11, 1970, 47 y.o.   MRN: [id number]  HPI Here for another migraine headache that began this morning. She was here about a week ago and was given a Toradol shot with good relief. We tried her on Topamax for prevention but she could not tolerate this due to side effects. She use to see Dr. Domingo Cocking at the [facility], but apparently she has never had a head scan for her headaches. No other neurologic deficits.    Review of Systems  Constitutional: Negative.   Respiratory: Negative.   Cardiovascular: Negative.   Neurological: Positive for headaches.       Objective:   Physical Exam  Constitutional: She is oriented to person, place, and time.  In pain, photosensitive   Eyes: Pupils are equal, round, and reactive to light. Conjunctivae and EOM are normal.  Cardiovascular: Normal rate, regular rhythm, normal heart sounds and intact distal pulses.  Pulmonary/Chest: Effort normal and breath sounds normal.  Neurological: She is alert and oriented to person, place, and time. No cranial nerve deficit. She exhibits normal muscle tone. Coordination normal.          Assessment & Plan:  Migraines. She is given a Toradol shot today. We will set up a non-contrasted head CT soon. She will try Propranolol LA for prevention.  Alysia Penna, MD

## 2018-05-21 NOTE — Telephone Encounter (Signed)
Pt was seen on 11/11 and this medication was sent in for her.

## 2018-05-28 ENCOUNTER — Ambulatory Visit: Payer: BLUE CROSS/BLUE SHIELD | Admitting: Family Medicine

## 2018-05-28 ENCOUNTER — Ambulatory Visit (INDEPENDENT_AMBULATORY_CARE_PROVIDER_SITE_OTHER)
Admission: RE | Admit: 2018-05-28 | Discharge: 2018-05-28 | Disposition: A | Discharge status: Home / Self Care | Payer: BLUE CROSS/BLUE SHIELD | Source: Ambulatory Visit | Attending: Family Medicine | Admitting: Family Medicine

## 2018-05-28 ENCOUNTER — Encounter: Payer: Self-pay | Admitting: Family Medicine

## 2018-05-28 VITALS — BP 150/92 | HR 55 | Temp 98.1°F | Wt 196.1 lb

## 2018-05-28 DIAGNOSIS — R519 Headache, unspecified: Secondary | ICD-10-CM

## 2018-05-28 DIAGNOSIS — R51 Headache: Secondary | ICD-10-CM

## 2018-05-28 DIAGNOSIS — G43909 Migraine, unspecified, not intractable, without status migrainosus: Secondary | ICD-10-CM | POA: Diagnosis not present

## 2018-05-28 MED ORDER — KETOROLAC TROMETHAMINE 60 MG/2ML IM SOLN
60.0000 mg | Freq: Once | INTRAMUSCULAR | Status: AC
  Administered 2018-05-28: 60 mg via INTRAMUSCULAR

## 2018-05-28 NOTE — Progress Notes (Signed)
   Subjective:    Patient ID: Beverly Rogers, female    DOB: May 27, 1971, 47 y.o.   MRN: [id number]  HPI Here for another migraine headache. This is a typical global headache that she had when she woke up this morning. She has been taking propranolol for the past week, but of course it is  too soon to tell if it will help. She had the head CT this morning and we are waiting on the report.    Review of Systems  Constitutional: Negative.   Respiratory: Negative.   Cardiovascular: Negative.   Neurological: Positive for headaches.       Objective:   Physical Exam  Constitutional: She is oriented to person, place, and time. She appears well-developed and well-nourished.  Eyes: Pupils are equal, round, and reactive to light. Conjunctivae and EOM are normal.  Cardiovascular: Normal rate, regular rhythm, normal heart sounds and intact distal pulses.  Pulmonary/Chest: Effort normal and breath sounds normal.  Neurological: She is alert and oriented to person, place, and time.          Assessment & Plan:  Migraine. Given a Toradol shot.  Alysia Penna, MD

## 2018-06-04 ENCOUNTER — Telehealth: Payer: Self-pay | Admitting: *Deleted

## 2018-06-04 NOTE — Telephone Encounter (Signed)
Yes I remember filling these out to be faxed

## 2018-06-04 NOTE — Telephone Encounter (Signed)
Copied from [place] [id number]. Topic: General - Inquiry >> May 30, 2018  9:56 AM Vernona Rieger wrote: Reason for CRM: Patient said that Dr Sarajane Jews advised her that he faxed over her FMLA paperwork to her employer, they have not yet received them. She would like them to be sent to this fax [phone]. She said this is urgent. They told her if they did not receive forms by end of week it will be denied.   Dr. Sarajane Jews please advise if you remember completing these forms.

## 2018-06-10 NOTE — Telephone Encounter (Signed)
Forms were completed and faxed on 11/19 and scanned into the pts chart.

## 2018-06-19 ENCOUNTER — Other Ambulatory Visit: Payer: Self-pay | Admitting: Family Medicine

## 2018-07-11 ENCOUNTER — Telehealth: Payer: Self-pay | Admitting: *Deleted

## 2018-07-11 NOTE — Telephone Encounter (Signed)
Copied from [place] [id number]. Topic: Appointment Scheduling - Scheduling Inquiry for Clinic >> Jul 08, 2018 12:17 PM Virl Axe D wrote: Reason for CRM: Pt stated she has had 3 migraines within a week's time. She would like to be able to see Dr. Sarajane Jews for a shot. He is booked up as well as other providers. Pt would like to see if she can be worked in. Please advise. CB# [phone]   I will look at the schedule to see if we can get her scheduled.

## 2018-07-11 NOTE — Telephone Encounter (Signed)
Called and spoke with pt and she is aware of appt with Dr. Sarajane Jews for her migraine at 8:15 on Friday morning.

## 2018-07-12 ENCOUNTER — Ambulatory Visit: Payer: BLUE CROSS/BLUE SHIELD | Admitting: Family Medicine

## 2018-07-12 ENCOUNTER — Encounter: Payer: Self-pay | Admitting: Family Medicine

## 2018-07-12 VITALS — BP 128/82 | Temp 98.2°F | Wt 190.2 lb

## 2018-07-12 DIAGNOSIS — G43909 Migraine, unspecified, not intractable, without status migrainosus: Secondary | ICD-10-CM

## 2018-07-12 DIAGNOSIS — Z23 Encounter for immunization: Secondary | ICD-10-CM | POA: Diagnosis not present

## 2018-07-12 DIAGNOSIS — R300 Dysuria: Secondary | ICD-10-CM

## 2018-07-12 LAB — POCT URINALYSIS DIPSTICK
Blood, UA: NEGATIVE
Glucose, UA: NEGATIVE
Ketones, UA: NEGATIVE
Leukocytes, UA: NEGATIVE
Nitrite, UA: NEGATIVE
Protein, UA: POSITIVE — AB
Spec Grav, UA: >=1.03 — AB (ref 1.010–1.025)
Urobilinogen, UA: 1 E.U./dL
pH, UA: 5.5 (ref 5.0–8.0)

## 2018-07-12 MED ORDER — LORAZEPAM 0.5 MG PO TABS: 0.5000 mg | ORAL_TABLET | Freq: Three times a day (TID) | ORAL | 2 refills | Status: DC | PRN

## 2018-07-12 NOTE — Progress Notes (Signed)
   Subjective:    Patient ID: Beverly Rogers, female    DOB: October 25, 1970, 48 y.o.   MRN: [id number]  HPI Here for several issues. First she has had a migraine headache for the past 4 days. She is using Maxalt and Phenergan with mixed results. She feels a little better this morning. She has been taking Propranolol for prophylaxis but this has not helped much. She used to see Dr. Orie Rout at the [facility] a few years ago. Also for the past 2 days she has felt a little "tingling" when she urinates and some urgency. No fever.    Review of Systems  Constitutional: Negative.   Respiratory: Negative.   Cardiovascular: Negative.   Gastrointestinal: Positive for nausea and vomiting. Negative for abdominal distention, abdominal pain, constipation and diarrhea.  Genitourinary: Positive for dysuria and urgency.  Neurological: Positive for headaches.       Objective:   Physical Exam Constitutional:      Appearance: She is ill-appearing.     Comments: Photophobic   Cardiovascular:     Rate and Rhythm: Normal rate and regular rhythm.     Pulses: Normal pulses.     Heart sounds: Normal heart sounds.  Pulmonary:     Effort: Pulmonary effort is normal.     Breath sounds: Normal breath sounds.  Abdominal:     General: Abdomen is flat. Bowel sounds are normal. There is no distension.     Palpations: There is no mass.     Tenderness: There is no abdominal tenderness. There is no guarding or rebound.     Hernia: No hernia is present.  Neurological:     General: No focal deficit present.     Mental Status: She is alert and oriented to person, place, and time.           Assessment & Plan:  She is getting over a 4 day migraine. We will refer her back to Dr. Domingo Cocking to manage the migraines. She is a bit dehydrated so I advised her to drink plenty of fluids.  Alysia Penna, MD

## 2018-07-23 DIAGNOSIS — G43719 Chronic migraine without aura, intractable, without status migrainosus: Secondary | ICD-10-CM | POA: Diagnosis not present

## 2018-07-23 DIAGNOSIS — R51 Headache: Secondary | ICD-10-CM | POA: Diagnosis not present

## 2018-07-23 DIAGNOSIS — Z79899 Other long term (current) drug therapy: Secondary | ICD-10-CM | POA: Diagnosis not present

## 2018-07-23 DIAGNOSIS — Z049 Encounter for examination and observation for unspecified reason: Secondary | ICD-10-CM | POA: Diagnosis not present

## 2018-08-05 DIAGNOSIS — R51 Headache: Secondary | ICD-10-CM | POA: Diagnosis not present

## 2018-08-05 DIAGNOSIS — M9901 Segmental and somatic dysfunction of cervical region: Secondary | ICD-10-CM | POA: Diagnosis not present

## 2018-08-05 DIAGNOSIS — M9900 Segmental and somatic dysfunction of head region: Secondary | ICD-10-CM | POA: Diagnosis not present

## 2018-08-05 DIAGNOSIS — M542 Cervicalgia: Secondary | ICD-10-CM | POA: Diagnosis not present

## 2018-08-12 ENCOUNTER — Other Ambulatory Visit: Payer: Self-pay | Admitting: Family Medicine

## 2018-08-23 DIAGNOSIS — H15112 Episcleritis periodica fugax, left eye: Secondary | ICD-10-CM | POA: Diagnosis not present

## 2018-08-26 DIAGNOSIS — H15112 Episcleritis periodica fugax, left eye: Secondary | ICD-10-CM | POA: Diagnosis not present

## 2018-10-04 ENCOUNTER — Other Ambulatory Visit: Payer: Self-pay | Admitting: Family Medicine

## 2018-11-10 ENCOUNTER — Other Ambulatory Visit: Payer: Self-pay | Admitting: Family Medicine

## 2019-01-13 ENCOUNTER — Encounter: Payer: Self-pay | Admitting: Family Medicine

## 2019-01-13 ENCOUNTER — Ambulatory Visit (INDEPENDENT_AMBULATORY_CARE_PROVIDER_SITE_OTHER): Payer: BC Managed Care – PPO | Admitting: Family Medicine

## 2019-01-13 ENCOUNTER — Other Ambulatory Visit: Payer: Self-pay

## 2019-01-13 DIAGNOSIS — R590 Localized enlarged lymph nodes: Secondary | ICD-10-CM

## 2019-01-13 MED ORDER — FLUCONAZOLE 150 MG PO TABS: 150.0000 mg | ORAL_TABLET | Freq: Once | ORAL | 5 refills | Status: AC

## 2019-01-13 MED ORDER — AMOXICILLIN 500 MG PO CAPS: 500.0000 mg | ORAL_CAPSULE | Freq: Three times a day (TID) | ORAL | 0 refills | Status: DC

## 2019-01-13 NOTE — Progress Notes (Signed)
Subjective:    Patient ID: Beverly Rogers, female    DOB: Dec 07, 1970, 48 y.o.   MRN: [id number]  HPI Virtual Visit via Video Note  I connected with the patient on 01/13/19 at  4:15 PM EDT by a video enabled telemedicine application and verified that I am speaking with the correct person using two identifiers.  Location patient: home Location provider:work or home office Persons participating in the virtual visit: patient, provider  I discussed the limitations of evaluation and management by telemedicine and the availability of in person appointments. The patient expressed understanding and agreed to proceed.   HPI: Here to discuss a swollen tender lymph node that appeared 2 days ago. This is located just beneath the angle of the right jaw. No fever or ST. She feels fine otherwise. Of note, she saw the dentist last week for a cleaning, and they found some cavities. She is scheduled to have these filled on 01-24-19.    ROS: See pertinent positives and negatives per HPI.  Past Medical History:  Diagnosis Date  . Allergy   . Arthritis   . Depression   . Hx of appendectomy   . Hypertension   . IBS (irritable bowel syndrome)   . Migraine   . Vitamin D deficiency     Past Surgical History:  Procedure Laterality Date  . APPENDECTOMY    . FRACTURE SURGERY     repair in the rt lower leg screwsand plate in place    Family History  Problem Relation Age of Onset  . Colon cancer Neg Hx      Current Outpatient Medications:  .  cyclobenzaprine (FLEXERIL) 10 MG tablet, Take 1 tablet (10 mg total) by mouth 3 (three) times daily as needed for muscle spasms., Disp: 60 tablet, Rfl: 1 .  estradiol (CLIMARA - DOSED IN MG/24 HR) 0.025 mg/24hr patch, Place 1 patch (0.025 mg total) onto the skin 2 (two) times a week., Disp: 4 patch, Rfl: 12 .  furosemide (LASIX) 40 MG tablet, TAKE 1 TABLET BY MOUTH EVERY DAY, Disp: 90 tablet, Rfl: 0 .  halobetasol (ULTRAVATE) 0.05 % cream, Apply topically  2 (two) times daily as needed. For eczema, Disp: 50 g, Rfl: 5 .  HYDROcodone-homatropine (HYDROMET) 5-1.5 MG/5ML syrup, Take 5 mLs by mouth every 4 (four) hours as needed., Disp: 240 mL, Rfl: 0 .  ketoconazole (NIZORAL) 2 % cream, Apply 1 application topically 2 (two) times daily as needed for irritation (yeast infection)., Disp: 30 g, Rfl: 5 .  lisinopril-hydrochlorothiazide (PRINZIDE,ZESTORETIC) 10-12.5 MG tablet, Take 1 tablet by mouth daily., Disp: 30 tablet, Rfl: 3 .  LORazepam (ATIVAN) 0.5 MG tablet, Take 1 tablet (0.5 mg total) by mouth every 8 (eight) hours as needed for anxiety., Disp: 60 tablet, Rfl: 2 .  propranolol ER (INDERAL LA) 60 MG 24 hr capsule, TAKE 1 CAPSULE (60 MG TOTAL) BY MOUTH AT BEDTIME., Disp: 90 capsule, Rfl: 0 .  rizatriptan (MAXALT) 10 MG tablet, TAKE 1 TABLET BY MOUTH AS NEEDED FOR MIGAINE. MAY REPEAT IN 2 HOURS IF NEEDED, Disp: 10 tablet, Rfl: 11 .  zolpidem (AMBIEN CR) 6.25 MG CR tablet, Take 6.25 mg by mouth at bedtime as needed for sleep., Disp: , Rfl:  .  amoxicillin (AMOXIL) 500 MG capsule, Take 1 capsule (500 mg total) by mouth 3 (three) times daily., Disp: 30 capsule, Rfl: 0 .  fluconazole (DIFLUCAN) 150 MG tablet, Take 1 tablet (150 mg total) by mouth once for 1 dose., Disp: 1  tablet, Rfl: 5 .  promethazine (PHENERGAN) 25 MG tablet, Take 1 tablet (25 mg total) by mouth every 4 (four) hours as needed for up to 7 days for nausea., Disp: 60 tablet, Rfl: 5  EXAM:  VITALS per patient if applicable:  GENERAL: alert, oriented, appears well and in no acute distress  HEENT: atraumatic, conjunttiva clear, no obvious abnormalities on inspection of external nose and ears  NECK: normal movements of the head and neck  LUNGS: on inspection no signs of respiratory distress, breathing rate appears normal, no obvious gross SOB, gasping or wheezing  CV: no obvious cyanosis  MS: moves all visible extremities without noticeable abnormality  PSYCH/NEURO: pleasant and  cooperative, no obvious depression or anxiety, speech and thought processing grossly intact  ASSESSMENT AND PLAN: Reactive AC lymph node. This is likely the result of some transient bacteremia from her dental cleaning. We will cover with 10 days of Amoxicillin. She can use warm compresses and Ibuprofen for any discomfort.  Alysia Penna, MD  Discussed the following assessment and plan:  No diagnosis found.     I discussed the assessment and treatment plan with the patient. The patient was provided an opportunity to ask questions and all were answered. The patient agreed with the plan and demonstrated an understanding of the instructions.   The patient was advised to call back or seek an in-person evaluation if the symptoms worsen or if the condition fails to improve as anticipated.     Review of Systems     Objective:   Physical Exam        Assessment & Plan:

## 2019-01-14 ENCOUNTER — Ambulatory Visit: Payer: Self-pay | Admitting: Family Medicine

## 2019-01-14 NOTE — Telephone Encounter (Signed)
Pt had virtual visit with Dr. Sarajane Jews yesterday "Adenopathy, cervical."  States "Feels worse today." Reports 2 new small swollen areas under right ear and neck is stiff." States neck "Stiffened as day wore on."  Denies fever,no headache, is staying hydrated. States started amoxicillin yesterday, just took 4th dose. Advised to give antibiotics more time, offered virtual appt, declined. States "Didn't really help since Dr. Sarajane Jews cannot feel my neck." Assured pt [place] would route to practice for Dr. Barbie Banner review. Care advise given, verbalizes understanding.  CB# [phone]  Reason for Disposition . [1] Caller requesting NON-URGENT health information AND [2] PCP's office is the best resource  Answer Assessment - Initial Assessment Questions 1. REASON FOR CALL or QUESTION: "What is your reason for calling today?" or "How can I best help you?" or "What question do you have that I can help answer?"  Protocols used: INFORMATION ONLY CALL-A-AH

## 2019-01-15 NOTE — Telephone Encounter (Signed)
I agree, we need to give the medication a little more time to work

## 2019-04-24 ENCOUNTER — Encounter: Payer: Self-pay | Admitting: Gastroenterology

## 2019-04-30 DIAGNOSIS — Z79890 Hormone replacement therapy (postmenopausal): Secondary | ICD-10-CM | POA: Diagnosis not present

## 2019-04-30 DIAGNOSIS — F5101 Primary insomnia: Secondary | ICD-10-CM | POA: Diagnosis not present

## 2019-04-30 DIAGNOSIS — Z01419 Encounter for gynecological examination (general) (routine) without abnormal findings: Secondary | ICD-10-CM | POA: Diagnosis not present

## 2019-04-30 DIAGNOSIS — N951 Menopausal and female climacteric states: Secondary | ICD-10-CM | POA: Diagnosis not present

## 2019-05-08 DIAGNOSIS — Z1231 Encounter for screening mammogram for malignant neoplasm of breast: Secondary | ICD-10-CM | POA: Diagnosis not present

## 2019-05-13 ENCOUNTER — Ambulatory Visit (INDEPENDENT_AMBULATORY_CARE_PROVIDER_SITE_OTHER): Payer: BC Managed Care – PPO

## 2019-05-13 ENCOUNTER — Other Ambulatory Visit: Payer: Self-pay

## 2019-05-13 ENCOUNTER — Encounter: Payer: Self-pay | Admitting: Family Medicine

## 2019-05-13 DIAGNOSIS — Z23 Encounter for immunization: Secondary | ICD-10-CM | POA: Diagnosis not present

## 2019-05-29 ENCOUNTER — Encounter: Payer: Self-pay | Admitting: Gastroenterology

## 2019-05-29 ENCOUNTER — Ambulatory Visit (INDEPENDENT_AMBULATORY_CARE_PROVIDER_SITE_OTHER): Payer: BC Managed Care – PPO | Admitting: Gastroenterology

## 2019-05-29 ENCOUNTER — Other Ambulatory Visit: Payer: Self-pay

## 2019-05-29 VITALS — BP 150/90 | HR 66 | Temp 97.6°F | Ht 65.0 in | Wt 199.0 lb

## 2019-05-29 DIAGNOSIS — R198 Other specified symptoms and signs involving the digestive system and abdomen: Secondary | ICD-10-CM | POA: Diagnosis not present

## 2019-05-29 DIAGNOSIS — R0989 Other specified symptoms and signs involving the circulatory and respiratory systems: Secondary | ICD-10-CM | POA: Diagnosis not present

## 2019-05-29 DIAGNOSIS — Z1159 Encounter for screening for other viral diseases: Secondary | ICD-10-CM

## 2019-05-29 DIAGNOSIS — K581 Irritable bowel syndrome with constipation: Secondary | ICD-10-CM

## 2019-05-29 DIAGNOSIS — R110 Nausea: Secondary | ICD-10-CM | POA: Diagnosis not present

## 2019-05-29 MED ORDER — LINACLOTIDE 145 MCG PO CAPS: 145.0000 ug | ORAL_CAPSULE | Freq: Every day | ORAL | 0 refills | Status: DC

## 2019-05-29 MED ORDER — OMEPRAZOLE 40 MG PO CPDR: 40.0000 mg | DELAYED_RELEASE_CAPSULE | Freq: Every day | ORAL | 3 refills | Status: DC

## 2019-05-29 NOTE — Progress Notes (Signed)
HPI :  48 y/o female with a history of arthritis, IBS-C, migraines, referred here by Alysia Penna, MD for globus and digestive complaints.  She was previously followed here by Dr. Sharlett Iles but has not been seen here in years.  She states around June to July timeframe she noticed a sensation in her throat, she states it felt like it was " closed" and restricted.  At first she was concerned she might have a shellfish allergy and avoided all shellfish but did not have any improvement.  Since that time the symptom has changed into a sense of globus.  She states it feels like a " chicken bone" is stuck in her throat at all times and never really goes away.  She does find a sharpness to the sensation which can somewhat be relieved with swallowing.  She denies any pain with swallowing.  She also has a sensation as if food is " sitting in my lower chest".  She denies overt dysphagia with her meals or with pills, but has a chronic sensation of something in her lower chest that is there all the time.  She has intermittent nausea with this but no vomiting.  She denies any postprandial upper abdominal pains on a routine basis.  She denies much pyrosis or regurgitation.  No cough.  She does take some Advil for migraines as needed but not frequently.  She denies any weight loss.  No early satiety.  She denies any cardiopulmonary symptoms and has a good exertional capacity.  She does have some chronic neck pain with some radiation down her right arm at times.  She endorses longstanding IBS-C.  She had a colonoscopy in 2012 which was normal.  She denies any blood in the stools on a routine basis.  She has chronic constipation, she has a bowel movement once every week to once every other week.  She feels bloated and uncomfortable if she does not move her bowels.  She is not really taking anything for this.  She has tried MiraLAX over-the-counter in the past which did not provide too much relief.  She asked about other  options.  She is never had a prior EGD.  No family history of colon cancer or esophageal or gastric cancer.  Colonoscopy 10/05/2010 - normal   Past Medical History:  Diagnosis Date  . Allergy   . Arthritis   . Depression   . Hx of appendectomy   . Hypertension   . IBS (irritable bowel syndrome)   . Migraine   . Vitamin D deficiency      Past Surgical History:  Procedure Laterality Date  . APPENDECTOMY    . FRACTURE SURGERY     repair in the rt lower leg screwsand plate in place   Family History  Problem Relation Age of Onset  . Colon cancer Neg Hx    Social History   Tobacco Use  . Smoking status: Never Smoker  . Smokeless tobacco: Never Used  Substance Use Topics  . Alcohol use: No    Alcohol/week: 0.0 standard drinks  . Drug use: No   Current Outpatient Medications  Medication Sig Dispense Refill  . cyclobenzaprine (FLEXERIL) 10 MG tablet Take 1 tablet (10 mg total) by mouth 3 (three) times daily as needed for muscle spasms. 60 tablet 1  . estradiol (CLIMARA - DOSED IN MG/24 HR) 0.025 mg/24hr patch Place 1 patch (0.025 mg total) onto the skin 2 (two) times a week. 4 patch 12  . furosemide (  LASIX) 40 MG tablet TAKE 1 TABLET BY MOUTH EVERY DAY 90 tablet 0  . halobetasol (ULTRAVATE) 0.05 % cream Apply topically 2 (two) times daily as needed. For eczema 50 g 5  . ketoconazole (NIZORAL) 2 % cream Apply 1 application topically 2 (two) times daily as needed for irritation (yeast infection). 30 g 5  . LORazepam (ATIVAN) 0.5 MG tablet Take 1 tablet (0.5 mg total) by mouth every 8 (eight) hours as needed for anxiety. 60 tablet 2  . rizatriptan (MAXALT) 10 MG tablet TAKE 1 TABLET BY MOUTH AS NEEDED FOR MIGAINE. MAY REPEAT IN 2 HOURS IF NEEDED 10 tablet 11  . zolpidem (AMBIEN CR) 6.25 MG CR tablet Take 6.25 mg by mouth at bedtime as needed for sleep.    . promethazine (PHENERGAN) 25 MG tablet Take 1 tablet (25 mg total) by mouth every 4 (four) hours as needed for up to 7 days  for nausea. 60 tablet 5   No current facility-administered medications for this visit.    Allergies  Allergen Reactions  . Bactrim [Sulfamethoxazole-Trimethoprim] Hives  . Ciprofloxacin      Review of Systems: All systems reviewed and negative except where noted in HPI.   Lab Results  Component Value Date   WBC 5.5 11/05/2017   HGB 12.2 11/05/2017   HCT 37.3 11/05/2017   MCV 83.5 11/05/2017   PLT 284.0 11/05/2017    Lab Results  Component Value Date   CREATININE 0.70 11/05/2017   BUN 10 11/05/2017   NA 139 11/05/2017   K 3.8 11/05/2017   CL 102 11/05/2017   CO2 27 11/05/2017    Lab Results  Component Value Date   ALT 11 09/10/2015   AST 16 09/10/2015   ALKPHOS 59 09/10/2015   BILITOT 0.6 09/10/2015     Physical Exam: BP (!) 150/90   Pulse 66   Temp 97.6 F (36.4 C)   Ht 5\' 5"  (1.651 m)   Wt 199 lb (90.3 kg)   LMP 09/29/2010   BMI 33.12 kg/m  Constitutional: Pleasant,well-developed, female in no acute distress. HEENT: Normocephalic and atraumatic. Conjunctivae are normal. No scleral icterus. Neck supple.  Cardiovascular: Normal rate, regular rhythm.  Pulmonary/chest: Effort normal and breath sounds normal. No wheezing, rales or rhonchi. Abdominal: Soft, nondistended, nontender.  There are no masses palpable. No hepatomegaly. Extremities: no edema Lymphadenopathy: No cervical adenopathy noted. Neurological: Alert and oriented to person place and time. Skin: Skin is warm and dry. No rashes noted. Psychiatric: Normal mood and affect. Behavior is normal.   ASSESSMENT AND PLAN: 48 year old female here for new patient consultation regarding the following:  Globus / sensation of the lower chest / nausea - discussed differential for the symptoms with her.  Reflux could certainly cause all of these sensations although she does not have more typical symptoms of reflux.  EoE, candidiasis, motility disorder, etc. also on the differential.  I am recommending an  EGD to further evaluate and clarify what is driving this.  I discussed risk and benefits of endoscopy and anesthesia and she want to proceed.  In the interim I recommend we start her on a moderate dose of a PPI to see if she has response to acid suppression.  In this light I am recommending omeprazole 40 mg once a day until we can perform her endoscopy in the next few weeks.  Further recommendations pending the results of her exam and her course to omeprazole.  She agreed.  I recommend she follow-up with  her primary care for her neck pain and shoulder pain, this does not sound cardiac to me and is more likely musculoskeletal but defer to Dr. Sarajane Jews.  IBS-C - longstanding constipation, she has had a prior colonoscopy remotely for this which was negative.  She has not really taken much for this other than MiraLAX which did not provide too much help.  I discussed options with her, will give her a trial of Linzess 145 mcg a day she can titrate up or down as needed.  If this works for her, she will call back for a prescription, if not we will discuss other options.  She agreed.  She is otherwise due for her next colonoscopy in March 2022.  Haworth Cellar, MD [facility]  CC: Laurey Morale, MD

## 2019-05-29 NOTE — Patient Instructions (Addendum)
If you are age 50 or older, your body mass index should be between 23-30. Your Body mass index is 33.12 kg/m. If this is out of the aforementioned range listed, please consider follow up with your Primary Care Provider.  If you are age 51 or younger, your body mass index should be between 19-25. Your Body mass index is 33.12 kg/m. If this is out of the aformentioned range listed, please consider follow up with your Primary Care Provider.   To help prevent the possible spread of infection to our patients, communities, and staff; we will be implementing the following measures:  As of now we are not allowing any visitors/family members to accompany you to any upcoming appointments with [facility]. If you have any concerns about this please contact our office to discuss prior to the appointment.   You have been scheduled for an endoscopy. Please follow written instructions given to you at your visit today. If you use inhalers (even only as needed), please bring them with you on the day of your procedure. Your physician has requested that you go to www.startemmi.com and enter the access code given to you at your visit today. This web site gives a general overview about your procedure. However, you should still follow specific instructions given to you by our office regarding your preparation for the procedure.  We are giving you samples of Linzess 18mcg: Take once daily in the am  We have sent the following medications to your pharmacy for you to pick up at your convenience: Omeprazole 40mg : Take once a day  You will be due for a recall colonoscopy in 09-2020. We will send you a reminder in the mail when it gets closer to that time.  Thank you for entrusting me with your care and for choosing [facility], Dr. Mohnton Cellar

## 2019-06-04 ENCOUNTER — Ambulatory Visit: Payer: BC Managed Care – PPO | Admitting: Family Medicine

## 2019-06-04 ENCOUNTER — Other Ambulatory Visit: Payer: Self-pay

## 2019-06-04 ENCOUNTER — Encounter: Payer: Self-pay | Admitting: Family Medicine

## 2019-06-04 VITALS — BP 142/96 | HR 88 | Temp 97.2°F | Wt 201.2 lb

## 2019-06-04 DIAGNOSIS — F418 Other specified anxiety disorders: Secondary | ICD-10-CM | POA: Insufficient documentation

## 2019-06-04 DIAGNOSIS — G8929 Other chronic pain: Secondary | ICD-10-CM | POA: Diagnosis not present

## 2019-06-04 DIAGNOSIS — M479 Spondylosis, unspecified: Secondary | ICD-10-CM | POA: Diagnosis not present

## 2019-06-04 DIAGNOSIS — M542 Cervicalgia: Secondary | ICD-10-CM

## 2019-06-04 MED ORDER — LORAZEPAM 0.5 MG PO TABS: 0.5000 mg | ORAL_TABLET | Freq: Three times a day (TID) | ORAL | 5 refills | Status: DC | PRN

## 2019-06-04 MED ORDER — CYCLOBENZAPRINE HCL 10 MG PO TABS: 10.0000 mg | ORAL_TABLET | Freq: Three times a day (TID) | ORAL | 5 refills | Status: DC | PRN

## 2019-06-04 NOTE — Progress Notes (Signed)
   Subjective:    Patient ID: Beverly Rogers, female    DOB: 10/17/1970, 48 y.o.   MRN: [id number]  HPI Here for refills on Flexeril for neck pain and on Lorazepam for anxiety. 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   Review of Systems  Constitutional: Negative.   Respiratory: Negative.   Cardiovascular: Negative.   Musculoskeletal: Positive for neck pain and neck stiffness.  Psychiatric/Behavioral: Negative for agitation, confusion, dysphoric mood and hallucinations. The patient is nervous/anxious.        Objective:   Physical Exam Constitutional:      Appearance: Normal appearance.  Cardiovascular:     Rate and Rhythm: Normal rate and regular rhythm.     Pulses: Normal pulses.     Heart sounds: Normal heart sounds.  Pulmonary:     Effort: Pulmonary effort is normal.     Breath sounds: Normal breath sounds.  Neurological:     General: No focal deficit present.     Mental Status: She is alert and oriented to person, place, and time.  Psychiatric:        Thought Content: Thought content normal.        Judgment: Judgment normal.     Comments: Anxious            Assessment & Plan:  For neck pain we refilled Flexeril. For anxiety we refilled Lorazepam. Recheck prn. Alysia Penna, MD

## 2019-06-13 ENCOUNTER — Other Ambulatory Visit: Payer: Self-pay | Admitting: Gastroenterology

## 2019-06-13 ENCOUNTER — Ambulatory Visit (INDEPENDENT_AMBULATORY_CARE_PROVIDER_SITE_OTHER): Payer: BC Managed Care – PPO

## 2019-06-13 ENCOUNTER — Telehealth: Payer: Self-pay | Admitting: Gastroenterology

## 2019-06-13 ENCOUNTER — Encounter: Payer: Self-pay | Admitting: Gastroenterology

## 2019-06-13 DIAGNOSIS — Z1159 Encounter for screening for other viral diseases: Secondary | ICD-10-CM

## 2019-06-13 LAB — SARS CORONAVIRUS 2 (TAT 6-24 HRS): SARS Coronavirus 2: NEGATIVE

## 2019-06-13 NOTE — Telephone Encounter (Signed)
I saw in your note that patient was instructed to titrate the Linzess if needed.  Since this is working for her twice a day can I just sent a script for 220mcg? Twice a day 155mcg probably won't be covered. Thanks!

## 2019-06-13 NOTE — Telephone Encounter (Signed)
That's fine thanks Magda Paganini

## 2019-06-16 MED ORDER — LINACLOTIDE 290 MCG PO CAPS: 290.0000 ug | ORAL_CAPSULE | Freq: Every day | ORAL | 1 refills | Status: DC

## 2019-06-16 NOTE — Telephone Encounter (Signed)
Sent Linzess 290 to pharmacy and sent Patient a mychart message to let her know.

## 2019-06-17 ENCOUNTER — Ambulatory Visit (AMBULATORY_SURGERY_CENTER): Payer: BC Managed Care – PPO | Admitting: Gastroenterology

## 2019-06-17 ENCOUNTER — Encounter: Payer: Self-pay | Admitting: Gastroenterology

## 2019-06-17 ENCOUNTER — Other Ambulatory Visit: Payer: Self-pay

## 2019-06-17 VITALS — BP 160/103 | HR 65 | Temp 98.5°F | Resp 15 | Ht 65.0 in | Wt 199.0 lb

## 2019-06-17 DIAGNOSIS — F458 Other somatoform disorders: Secondary | ICD-10-CM | POA: Diagnosis not present

## 2019-06-17 DIAGNOSIS — K449 Diaphragmatic hernia without obstruction or gangrene: Secondary | ICD-10-CM | POA: Diagnosis not present

## 2019-06-17 DIAGNOSIS — R198 Other specified symptoms and signs involving the digestive system and abdomen: Secondary | ICD-10-CM

## 2019-06-17 DIAGNOSIS — R0989 Other specified symptoms and signs involving the circulatory and respiratory systems: Secondary | ICD-10-CM | POA: Diagnosis not present

## 2019-06-17 MED ORDER — SODIUM CHLORIDE 0.9 % IV SOLN: 500.0000 mL | Freq: Once | INTRAVENOUS | Status: DC

## 2019-06-17 NOTE — Progress Notes (Signed)
VS-DT Temp-JR  

## 2019-06-17 NOTE — Patient Instructions (Signed)
Handouts:  Hiatal Hernia Resume previous diet Continue present medications including omeprazole Await pathology    YOU HAD AN ENDOSCOPIC PROCEDURE TODAY AT [address]:   Refer to the procedure report that was given to you for any specific questions about what was found during the examination.  If the procedure report does not answer your questions, please call your gastroenterologist to clarify.  If you requested that your care partner not be given the details of your procedure findings, then the procedure report has been included in a sealed envelope for you to review at your convenience later.  YOU SHOULD EXPECT: Some feelings of bloating in the abdomen. Passage of more gas than usual.  Walking can help get rid of the air that was put into your GI tract during the procedure and reduce the bloating. If you had a lower endoscopy (such as a colonoscopy or flexible sigmoidoscopy) you may notice spotting of blood in your stool or on the toilet paper. If you underwent a bowel prep for your procedure, you may not have a normal bowel movement for a few days.  Please Note:  You might notice some irritation and congestion in your nose or some drainage.  This is from the oxygen used during your procedure.  There is no need for concern and it should clear up in a day or so.  SYMPTOMS TO REPORT IMMEDIATELY:    Following upper endoscopy (EGD)  Vomiting of blood or coffee ground material  New chest pain or pain under the shoulder blades  Painful or persistently difficult swallowing  New shortness of breath  Fever of 100F or higher  Black, tarry-looking stools  For urgent or emergent issues, a gastroenterologist can be reached at any hour by calling [phone].   DIET:  We do recommend a small meal at first, but then you may proceed to your regular diet.  Drink plenty of fluids but you should avoid alcoholic beverages for 24 hours.  ACTIVITY:  You should plan to take it easy for  the rest of today and you should NOT DRIVE or use heavy machinery until tomorrow (because of the sedation medicines used during the test).    FOLLOW UP: Our staff will call the number listed on your records 48-72 hours following your procedure to check on you and address any questions or concerns that you may have regarding the information given to you following your procedure. If we do not reach you, we will leave a message.  We will attempt to reach you two times.  During this call, we will ask if you have developed any symptoms of COVID 19. If you develop any symptoms (ie: fever, flu-like symptoms, shortness of breath, cough etc.) before then, please call [phone].  If you test positive for Covid 19 in the 2 weeks post procedure, please call and report this information to [place].    If any biopsies were taken you will be contacted by phone or by letter within the next 1-3 weeks.  Please call us at [phone] if you have not heard about the biopsies in 3 weeks.    SIGNATURES/CONFIDENTIALITY: You and/or your care partner have signed paperwork which will be entered into your electronic medical record.  These signatures attest to the fact that that the information above on your After Visit Summary has been reviewed and is understood.  Full responsibility of the confidentiality of this discharge information lies with you and/or your care-partner.

## 2019-06-17 NOTE — Op Note (Signed)
[place] Patient Name: Beverly Rogers Procedure Date: 06/17/2019 4:15 PM MRN: [id number] Endoscopist: Remo Lipps P. Havery Moros , MD Age: 48 Referring MD:  Date of Birth: [birth year] Gender: Female Account #: [id number] Procedure:                Upper GI endoscopy Indications:              Globus sensation, atypical sensation in the chest -                            improved with omeprazole 40mg  once daily Medicines:                Monitored Anesthesia Care Procedure:                Pre-Anesthesia Assessment:                           - Prior to the procedure, a History and Physical                            was performed, and patient medications and                            allergies were reviewed. The patient's tolerance of                            previous anesthesia was also reviewed. The risks                            and benefits of the procedure and the sedation                            options and risks were discussed with the patient.                            All questions were answered, and informed consent                            was obtained. Prior Anticoagulants: The patient has                            taken no previous anticoagulant or antiplatelet                            agents. ASA Grade Assessment: II - A patient with                            mild systemic disease. After reviewing the risks                            and benefits, the patient was deemed in                            satisfactory condition to undergo the procedure.  After obtaining informed consent, the endoscope was                            passed under direct vision. Throughout the                            procedure, the patient's blood pressure, pulse, and                            oxygen saturations were monitored continuously. The                            Endoscope was introduced through the mouth, and   advanced to the second part of duodenum. The upper                            GI endoscopy was accomplished without difficulty.                            The patient tolerated the procedure well. Scope In: Scope Out: Findings:                 Esophagogastric landmarks were identified: the                            Z-line was found at 38 cm, the gastroesophageal                            junction was found at 38 cm and the upper extent of                            the gastric folds was found at 39 cm from the                            incisors.                           A 1 cm hiatal hernia was present.                           The exam of the esophagus was otherwise normal.                           Biopsies were taken with a cold forceps in the                            upper third of the esophagus, in the middle third                            of the esophagus and in the lower third of the                            esophagus for histology, rule out eosinophilic  esophagitis.                           The entire examined stomach was normal.                           The duodenal bulb and second portion of the                            duodenum were normal. Complications:            No immediate complications. Estimated blood loss:                            Minimal. Estimated Blood Loss:     Estimated blood loss was minimal. Impression:               - Esophagogastric landmarks identified.                           - 1 cm hiatal hernia.                           - Normal esophagus - biopsies taken to rule out                            eosinophilic esophagitis                           - Normal stomach.                           - Normal duodenal bulb and second portion of the                            duodenum.                           Suspect symptoms could be related to reflux given                            interval improvement with trial of  moderate dose                            omeprazole. Recommendation:           - Patient has a contact number available for                            emergencies. The signs and symptoms of potential                            delayed complications were discussed with the                            patient. Return to normal activities tomorrow.  Written discharge instructions were provided to the                            patient.                           - Resume previous diet.                           - Continue present medications including omeprazole                           - Await pathology results. Remo Lipps P. Beverly Schmoll, MD 06/17/2019 4:52:44 PM This report has been signed electronically.

## 2019-06-17 NOTE — Progress Notes (Signed)
Called to room to assist during endoscopic procedure.  Patient ID and intended procedure confirmed with present staff. Received instructions for my participation in the procedure from the performing physician.  

## 2019-06-17 NOTE — Progress Notes (Signed)
Report given to PACU, vss 

## 2019-06-19 ENCOUNTER — Telehealth: Payer: Self-pay

## 2019-06-19 NOTE — Telephone Encounter (Signed)
1st follow up call made.  NALM 

## 2019-06-19 NOTE — Telephone Encounter (Signed)
  Follow up Call-  Call back number 06/17/2019  Post procedure Call Back phone  # [phone]  Permission to leave phone message Yes  Some recent data might be hidden     Patient questions:  Do you have a fever, pain , or abdominal swelling? No. Pain Score  0 *  Have you tolerated food without any problems? Yes.    Have you been able to return to your normal activities? Yes.    Do you have any questions about your discharge instructions: Diet   No. Medications  No. Follow up visit  No.  Do you have questions or concerns about your Care? No.  Actions: * If pain score is 4 or above: No action needed, pain <4.  1. Have you developed a fever since your procedure? no  2.   Have you had an respiratory symptoms (SOB or cough) since your procedure? no  3.   Have you tested positive for COVID 19 since your procedure no  4.   Have you had any family members/close contacts diagnosed with the COVID 19 since your procedure?  no   If yes to any of these questions please route to Joylene John, RN and Alphonsa Gin, Therapist, sports.

## 2019-07-08 ENCOUNTER — Encounter: Payer: Self-pay | Admitting: Family Medicine

## 2019-07-08 ENCOUNTER — Telehealth (INDEPENDENT_AMBULATORY_CARE_PROVIDER_SITE_OTHER): Payer: BC Managed Care – PPO | Admitting: Family Medicine

## 2019-07-08 ENCOUNTER — Other Ambulatory Visit: Payer: Self-pay

## 2019-07-08 ENCOUNTER — Other Ambulatory Visit: Payer: Self-pay | Admitting: Family Medicine

## 2019-07-08 DIAGNOSIS — I10 Essential (primary) hypertension: Secondary | ICD-10-CM | POA: Diagnosis not present

## 2019-07-08 MED ORDER — FUROSEMIDE 20 MG PO TABS: 20.0000 mg | ORAL_TABLET | Freq: Every day | ORAL | 3 refills | Status: DC

## 2019-07-08 MED ORDER — AMLODIPINE BESYLATE 5 MG PO TABS: 5.0000 mg | ORAL_TABLET | Freq: Every day | ORAL | 3 refills | Status: DC

## 2019-07-08 NOTE — Progress Notes (Signed)
Virtual Visit via Video Note  I connected with the patient on 07/08/19 at  3:15 PM EST by a video enabled telemedicine application and verified that I am speaking with the correct person using two identifiers.  Location patient: home Location provider:work or home office Persons participating in the virtual visit: patient, provider  I discussed the limitations of evaluation and management by telemedicine and the availability of in person appointments. The patient expressed understanding and agreed to proceed.   HPI: Here for elevated BP readings. She was treated a few years ago for HTN but was able to get off medications for awhile. However over the past few months this has been creeping back up.she has had a mild headache off and on this week, and she started checking the BP more frequently. She has been getting readings in the 160s and 170s over 110s mostly. No chest pain or SOB.    ROS: See pertinent positives and negatives per HPI.  Past Medical History:  Diagnosis Date  . Allergy   . Anemia    "many years ago"  . Arthritis   . Depression    post partum  . Hx of appendectomy   . Hypertension   . IBS (irritable bowel syndrome)   . Migraine   . Vitamin D deficiency     Past Surgical History:  Procedure Laterality Date  . APPENDECTOMY    . FRACTURE SURGERY     repair in the rt lower leg screwsand plate in place  . PARTIAL HYSTERECTOMY  11/16/2010    Family History  Problem Relation Age of Onset  . Colon cancer Neg Hx   . Esophageal cancer Neg Hx   . Rectal cancer Neg Hx   . Stomach cancer Neg Hx      Current Outpatient Medications:  .  cyclobenzaprine (FLEXERIL) 10 MG tablet, Take 1 tablet (10 mg total) by mouth 3 (three) times daily as needed for muscle spasms., Disp: 90 tablet, Rfl: 5 .  estradiol (CLIMARA - DOSED IN MG/24 HR) 0.025 mg/24hr patch, Place 1 patch (0.025 mg total) onto the skin 2 (two) times a week., Disp: 4 patch, Rfl: 12 .  halobetasol (ULTRAVATE)  0.05 % cream, Apply topically 2 (two) times daily as needed. For eczema, Disp: 50 g, Rfl: 5 .  ketoconazole (NIZORAL) 2 % cream, Apply 1 application topically 2 (two) times daily as needed for irritation (yeast infection)., Disp: 30 g, Rfl: 5 .  linaclotide (LINZESS) 290 MCG CAPS capsule, Take 1 capsule (290 mcg total) by mouth daily before breakfast., Disp: 90 capsule, Rfl: 1 .  LORazepam (ATIVAN) 0.5 MG tablet, Take 1 tablet (0.5 mg total) by mouth every 8 (eight) hours as needed for anxiety., Disp: 90 tablet, Rfl: 5 .  omeprazole (PRILOSEC) 40 MG capsule, Take 1 capsule (40 mg total) by mouth daily., Disp: 30 capsule, Rfl: 3 .  zolpidem (AMBIEN CR) 6.25 MG CR tablet, Take 6.25 mg by mouth at bedtime as needed for sleep., Disp: , Rfl:  .  amLODipine (NORVASC) 5 MG tablet, Take 1 tablet (5 mg total) by mouth daily., Disp: 30 tablet, Rfl: 3 .  furosemide (LASIX) 20 MG tablet, Take 1 tablet (20 mg total) by mouth daily., Disp: 90 tablet, Rfl: 3 .  promethazine (PHENERGAN) 25 MG tablet, Take 1 tablet (25 mg total) by mouth every 4 (four) hours as needed for up to 7 days for nausea., Disp: 60 tablet, Rfl: 5 .  rizatriptan (MAXALT) 10 MG tablet, TAKE 1  TABLET BY MOUTH AS NEEDED FOR MIGAINE. MAY REPEAT IN 2 HOURS IF NEEDED, Disp: 10 tablet, Rfl: 11  EXAM:  VITALS per patient if applicable:  GENERAL: alert, oriented, appears well and in no acute distress  HEENT: atraumatic, conjunttiva clear, no obvious abnormalities on inspection of external nose and ears  NECK: normal movements of the head and neck  LUNGS: on inspection no signs of respiratory distress, breathing rate appears normal, no obvious gross SOB, gasping or wheezing  CV: no obvious cyanosis  MS: moves all visible extremities without noticeable abnormality  PSYCH/NEURO: pleasant and cooperative, no obvious depression or anxiety, speech and thought processing grossly intact  ASSESSMENT AND PLAN: HTN. She had been taking Lasix  sporadically for swelling in the hands and feet, but I now advised her to take this every day. We will also start her on Amlodipine 5 mg daily. Recheck in 3 weeks.  Alysia Penna, MD  Discussed the following assessment and plan:  No diagnosis found.     I discussed the assessment and treatment plan with the patient. The patient was provided an opportunity to ask questions and all were answered. The patient agreed with the plan and demonstrated an understanding of the instructions.   The patient was advised to call back or seek an in-person evaluation if the symptoms worsen or if the condition fails to improve as anticipated.

## 2019-07-24 ENCOUNTER — Encounter: Payer: Self-pay | Admitting: Family Medicine

## 2019-07-24 NOTE — Telephone Encounter (Signed)
Appointment has been scheduled.

## 2019-07-24 NOTE — Telephone Encounter (Signed)
Set up an in person OV tomorrow. We need to adjust her medications and I agree with some time off work while we do this

## 2019-07-25 ENCOUNTER — Other Ambulatory Visit: Payer: Self-pay

## 2019-07-25 ENCOUNTER — Ambulatory Visit: Payer: 59 | Admitting: Family Medicine

## 2019-07-25 ENCOUNTER — Other Ambulatory Visit (INDEPENDENT_AMBULATORY_CARE_PROVIDER_SITE_OTHER): Payer: 59

## 2019-07-25 ENCOUNTER — Encounter: Payer: Self-pay | Admitting: Family Medicine

## 2019-07-25 VITALS — BP 160/90 | HR 112 | Temp 97.6°F | Wt 206.8 lb

## 2019-07-25 DIAGNOSIS — I10 Essential (primary) hypertension: Secondary | ICD-10-CM

## 2019-07-25 LAB — CBC WITH DIFFERENTIAL/PLATELET
Basophils Absolute: 0.1 10*3/uL (ref 0.0–0.1)
Basophils Relative: 1.6 % (ref 0.0–3.0)
Eosinophils Absolute: 0.2 10*3/uL (ref 0.0–0.7)
Eosinophils Relative: 2.4 % (ref 0.0–5.0)
HCT: 40.9 % (ref 36.0–46.0)
Hemoglobin: 12.8 g/dL (ref 12.0–15.0)
Lymphocytes Relative: 48 % — ABNORMAL HIGH (ref 12.0–46.0)
Lymphs Abs: 3.5 10*3/uL (ref 0.7–4.0)
MCHC: 31.3 g/dL (ref 30.0–36.0)
MCV: 82.3 fl (ref 78.0–100.0)
Monocytes Absolute: 0.8 10*3/uL (ref 0.1–1.0)
Monocytes Relative: 11.3 % (ref 3.0–12.0)
Neutro Abs: 2.7 10*3/uL (ref 1.4–7.7)
Neutrophils Relative %: 36.7 % — ABNORMAL LOW (ref 43.0–77.0)
Platelets: 298 10*3/uL (ref 150.0–400.0)
RBC: 4.97 Mil/uL (ref 3.87–5.11)
RDW: 14.8 % (ref 11.5–15.5)
WBC: 7.3 10*3/uL (ref 4.0–10.5)

## 2019-07-25 LAB — BASIC METABOLIC PANEL
BUN: 12 mg/dL (ref 6–23)
CO2: 29 mEq/L (ref 19–32)
Calcium: 9.6 mg/dL (ref 8.4–10.5)
Chloride: 104 mEq/L (ref 96–112)
Creatinine, Ser: 0.68 mg/dL (ref 0.40–1.20)
GFR: 110.58 mL/min (ref >60.00)
Glucose, Bld: 88 mg/dL (ref 70–99)
Potassium: 4.3 mEq/L (ref 3.5–5.1)
Sodium: 142 mEq/L (ref 135–145)

## 2019-07-25 LAB — HEPATIC FUNCTION PANEL
ALT: 18 U/L (ref 0–35)
AST: 20 U/L (ref 0–37)
Albumin: 4.4 g/dL (ref 3.5–5.2)
Alkaline Phosphatase: 98 U/L (ref 39–117)
Bilirubin, Direct: 0.1 mg/dL (ref 0.0–0.3)
Total Bilirubin: 0.4 mg/dL (ref 0.2–1.2)
Total Protein: 7.7 g/dL (ref 6.0–8.3)

## 2019-07-25 MED ORDER — METOPROLOL SUCCINATE ER 50 MG PO TB24: 50.0000 mg | ORAL_TABLET | Freq: Every day | ORAL | 3 refills | Status: DC

## 2019-07-25 NOTE — Addendum Note (Signed)
Addended by: Alysia Penna A on: 07/25/2019 04:58 PM   Modules accepted: Orders

## 2019-07-25 NOTE — Progress Notes (Signed)
   Subjective:    Patient ID: Beverly Rogers, female    DOB: 11-Aug-1970, 49 y.o.   MRN: [id number]  HPI Here for elevated BP readings over the past few weeks. These are averaging 160s to 180s over 100-110. She feels a little light headed and jittery, but no headache or chest pain or SOB. She is taking Amlodipine 5 mg and Lasix 20 mg each morning. She does have mild ankle swelling in both ankles.    Review of Systems  Constitutional: Negative.   Respiratory: Negative.   Cardiovascular: Positive for leg swelling. Negative for chest pain and palpitations.  Neurological: Positive for light-headedness. Negative for headaches.       Objective:   Physical Exam Constitutional:      Appearance: Normal appearance.  Cardiovascular:     Rate and Rhythm: Regular rhythm. Tachycardia present.     Pulses: Normal pulses.     Heart sounds: Normal heart sounds.     Comments: EKG shows sinus tachycardia  Pulmonary:     Effort: Pulmonary effort is normal.     Breath sounds: Normal breath sounds.  Musculoskeletal:     Comments: Trace edema in both ankles   Neurological:     Mental Status: She is alert.           Assessment & Plan:  Accelerated HTN. We will get labs today to rule out thyroid or renal issues, etc. Start on Metoprolol succinate 50 mg daily. Recheck in 2 weeks.  Alysia Penna, MD

## 2019-07-28 LAB — TSH: TSH: 1.88 u[IU]/mL (ref 0.35–4.50)

## 2019-08-18 ENCOUNTER — Other Ambulatory Visit: Payer: Self-pay

## 2019-08-18 ENCOUNTER — Ambulatory Visit: Payer: 59 | Admitting: Podiatry

## 2019-08-18 DIAGNOSIS — L603 Nail dystrophy: Secondary | ICD-10-CM

## 2019-08-20 NOTE — Progress Notes (Signed)
   Subjective: 49 y.o. female presenting today as a new patient with a chief complaint of discoloration of the bilateral great toenails that appeared about 1.5 years ago. She reports previous injuries to the nails in the past to walking barefoot frequently. She has not done anything for treatment and denies modifying factors. Patient is here for further evaluation and treatment.   Past Medical History:  Diagnosis Date  . Allergy   . Anemia    "many years ago"  . Arthritis   . Depression    post partum  . Hx of appendectomy   . Hypertension   . IBS (irritable bowel syndrome)   . Migraine   . Vitamin D deficiency     Objective: Physical Exam General: The patient is alert and oriented x3 in no acute distress.  Dermatology: Hyperkeratotic, discolored, thickened, onychodystrophy noted to the bilateral great toenails. Skin is warm, dry and supple bilateral lower extremities. Negative for open lesions or macerations.  Vascular: Palpable pedal pulses bilaterally. No edema or erythema noted. Capillary refill within normal limits.  Neurological: Epicritic and protective threshold grossly intact bilaterally.   Musculoskeletal Exam: Range of motion within normal limits to all pedal and ankle joints bilateral. Muscle strength 5/5 in all groups bilateral.   Assessment: #1 Dystrophic nails bilateral great toes   Plan of Care:  #1 Patient was evaluated. #2 Mechanical debridement of bilateral great toenails performed using a nail nipper. Filed with dremel without incident.  #3 Recommended OTC Tolcylen topical solution daily.  #4 Return to clinic as needed.    Edrick Kins, DPM [facility]  Dr. Edrick Kins, [address]                                        [place], [place] [address]                Office [phone]  Fax [phone]

## 2019-09-29 ENCOUNTER — Other Ambulatory Visit: Payer: Self-pay | Admitting: Family Medicine

## 2019-11-29 ENCOUNTER — Ambulatory Visit: Payer: 59 | Attending: Internal Medicine

## 2019-11-29 DIAGNOSIS — Z23 Encounter for immunization: Secondary | ICD-10-CM

## 2019-11-29 NOTE — Progress Notes (Signed)
   Covid-19 Vaccination Clinic  Name:  Beverly Rogers    MRN: [id number] DOB: [birth year]  11/29/2019  [name] was observed post Covid-19 immunization for 15 minutes without incident. She was provided with Vaccine Information Sheet and instruction to access the V-Safe system.   [name] was instructed to call 911 with any severe reactions post vaccine: Marland Kitchen Difficulty breathing  . Swelling of face and throat  . A fast heartbeat  . A bad rash all over body  . Dizziness and weakness   Immunizations Administered    Name Date Dose VIS Date Route   Pfizer COVID-19 Vaccine 11/29/2019  9:14 AM 0.3 mL 09/03/2018 Intramuscular   Manufacturer: [employer], [employer]   Lot: [id number]   [place]: [id number]

## 2019-12-22 ENCOUNTER — Ambulatory Visit: Payer: No Typology Code available for payment source | Attending: Internal Medicine

## 2019-12-22 DIAGNOSIS — Z23 Encounter for immunization: Secondary | ICD-10-CM

## 2019-12-22 NOTE — Progress Notes (Signed)
   Covid-19 Vaccination Clinic  Name:  ZAREA DIESING    MRN: [id number] DOB: [birth year]  12/22/2019  Ms. Alf was observed post Covid-19 immunization for 15 minutes without incident. She was provided with Vaccine Information Sheet and instruction to access the V-Safe system.   Ms. Khachatryan was instructed to call 911 with any severe reactions post vaccine: Marland Kitchen Difficulty breathing  . Swelling of face and throat  . A fast heartbeat  . A bad rash all over body  . Dizziness and weakness   Immunizations Administered    Name Date Dose VIS Date Route   Pfizer COVID-19 Vaccine 12/22/2019 12:13 PM 0.3 mL 09/03/2018 Intramuscular   Manufacturer: [employer], [employer]   Lot: [id number]   [place]: [id number]

## 2020-04-17 ENCOUNTER — Other Ambulatory Visit: Payer: Self-pay | Admitting: Gastroenterology

## 2020-06-09 ENCOUNTER — Telehealth: Payer: Self-pay | Admitting: Family Medicine

## 2020-06-09 NOTE — Telephone Encounter (Signed)
Pt is calling in wanting to have a Shingles vaccine is the pt eligible to get one.  Pt would like to be called to get scheduled and also wanted to know how long should she wait until she has the COVID  booster.

## 2020-06-09 NOTE — Telephone Encounter (Signed)
lft VM to rtn call. Dm/cma  

## 2020-06-09 NOTE — Telephone Encounter (Signed)
patient advised that she can make a nurse visit to get the shingles and to wait 2 weeks for the covid booster.   Dm/cma

## 2020-06-25 ENCOUNTER — Ambulatory Visit: Payer: No Typology Code available for payment source | Attending: Internal Medicine

## 2020-06-25 DIAGNOSIS — Z23 Encounter for immunization: Secondary | ICD-10-CM

## 2020-06-25 NOTE — Progress Notes (Signed)
   Covid-19 Vaccination Clinic  Name:  Beverly Rogers    MRN: [id number] DOB: 19-May-1971  06/25/2020  [name] was observed post Covid-19 immunization for 30 minutes based on pre-vaccination screening without incident. She was provided with Vaccine Information Sheet and instruction to access the V-Safe system.   [name] was instructed to call 911 with any severe reactions post vaccine: Marland Kitchen Difficulty breathing  . Swelling of face and throat  . A fast heartbeat  . A bad rash all over body  . Dizziness and weakness   Immunizations Administered    Name Date Dose VIS Date Route   Pfizer COVID-19 Vaccine 06/25/2020  5:17 PM 0.3 mL 04/28/2020 Intramuscular   Manufacturer: [place]   Lot: [id number]   [place]: [id number]

## 2020-06-27 ENCOUNTER — Other Ambulatory Visit: Payer: Self-pay | Admitting: Family Medicine

## 2020-06-30 ENCOUNTER — Ambulatory Visit: Payer: No Typology Code available for payment source

## 2020-07-06 ENCOUNTER — Other Ambulatory Visit: Payer: Self-pay

## 2020-07-06 ENCOUNTER — Ambulatory Visit (INDEPENDENT_AMBULATORY_CARE_PROVIDER_SITE_OTHER): Payer: No Typology Code available for payment source | Admitting: *Deleted

## 2020-07-06 DIAGNOSIS — Z23 Encounter for immunization: Secondary | ICD-10-CM | POA: Diagnosis not present

## 2020-07-09 ENCOUNTER — Other Ambulatory Visit: Payer: Self-pay | Admitting: Family Medicine

## 2020-07-14 NOTE — Telephone Encounter (Signed)
  LAST APPOINTMENT DATE:  07/25/19  NEXT APPOINTMENT DATE:@Visit  date not found  MEDICATION:Maxalt 10 mg PHARMACY:[facility] [address]  **Let patient know to contact pharmacy at the end of the day to make sure medication is ready. **  ** Please notify patient to allow 48-72 hours to process**  **Encourage patient to contact the pharmacy for refills or they can request refills through [facility]**  CLINICAL FILLS OUT ALL BELOW:   LAST REFILL:  07/08/19  QTY:   10 tabs with 11 refills  REFILL DATE:    OTHER COMMENTS:    Okay for refill?  Please advise

## 2020-08-02 ENCOUNTER — Other Ambulatory Visit: Payer: Self-pay | Admitting: Family Medicine

## 2020-08-03 ENCOUNTER — Ambulatory Visit: Payer: No Typology Code available for payment source | Admitting: Family Medicine

## 2020-08-03 ENCOUNTER — Other Ambulatory Visit: Payer: Self-pay

## 2020-08-03 ENCOUNTER — Ambulatory Visit (INDEPENDENT_AMBULATORY_CARE_PROVIDER_SITE_OTHER): Payer: No Typology Code available for payment source

## 2020-08-03 ENCOUNTER — Encounter: Payer: Self-pay | Admitting: Family Medicine

## 2020-08-03 VITALS — BP 130/90 | HR 82 | Temp 98.2°F | Ht 65.0 in | Wt 198.4 lb

## 2020-08-03 DIAGNOSIS — M25562 Pain in left knee: Secondary | ICD-10-CM

## 2020-08-03 MED ORDER — METHYLPREDNISOLONE 4 MG PO TBPK: ORAL_TABLET | ORAL | 0 refills | Status: DC

## 2020-08-03 MED ORDER — METHYLPREDNISOLONE ACETATE 40 MG/ML IJ SUSP
40.0000 mg | Freq: Once | INTRAMUSCULAR | Status: AC
  Administered 2020-08-03: 40 mg via INTRAMUSCULAR

## 2020-08-03 MED ORDER — METHYLPREDNISOLONE ACETATE 80 MG/ML IJ SUSP
80.0000 mg | Freq: Once | INTRAMUSCULAR | Status: AC
  Administered 2020-08-03: 80 mg via INTRAMUSCULAR

## 2020-08-03 NOTE — Addendum Note (Signed)
Addended by: Marijo Conception on: 08/03/2020 04:53 PM   Modules accepted: Orders

## 2020-08-03 NOTE — Progress Notes (Signed)
   Subjective:    Patient ID: Beverly Rogers, female    DOB: 07-13-1970, 50 y.o.   MRN: [id number]  HPI Here for the sudden onset of severe pain and swelling in the left knee that started in the middle of the night 6 weeks ago. No recent trauma. No redness or warmth. No fever. Using Naproxen and Icy Hot with little relief. This has never happened before. It hurts equally bad when bearing or not bearing weight on it.    Review of Systems  Constitutional: Negative.   Respiratory: Negative.   Cardiovascular: Negative.   Musculoskeletal: Positive for arthralgias and joint swelling.       Objective:   Physical Exam Constitutional:      Appearance: Normal appearance. She is not ill-appearing.  Cardiovascular:     Rate and Rhythm: Normal rate and regular rhythm.     Pulses: Normal pulses.     Heart sounds: Normal heart sounds.  Pulmonary:     Effort: Pulmonary effort is normal.     Breath sounds: Normal breath sounds.  Musculoskeletal:     Comments: The left knee has no obvious swelling. She is very tender along the medial joint space. ROM is full with no crepitus.   Neurological:     Mental Status: She is alert.           Assessment & Plan:  Left knee pain, likely due to gout. Treat with a shot of DepoMedrol, to be followed by a Medrol dose pack. Get Xrays of the knee today.  Alysia Penna, MD

## 2020-08-04 ENCOUNTER — Other Ambulatory Visit: Payer: Self-pay | Admitting: Family Medicine

## 2020-08-05 NOTE — Progress Notes (Signed)
Mychart message sent: Shows mild arthritis in the knee

## 2020-08-15 ENCOUNTER — Other Ambulatory Visit: Payer: Self-pay | Admitting: Gastroenterology

## 2020-09-08 ENCOUNTER — Encounter: Payer: Self-pay | Admitting: Family Medicine

## 2020-09-08 ENCOUNTER — Ambulatory Visit: Payer: No Typology Code available for payment source | Admitting: Family Medicine

## 2020-09-08 ENCOUNTER — Other Ambulatory Visit: Payer: Self-pay

## 2020-09-08 VITALS — BP 162/118 | HR 83 | Temp 98.7°F | Wt 201.6 lb

## 2020-09-08 DIAGNOSIS — K219 Gastro-esophageal reflux disease without esophagitis: Secondary | ICD-10-CM | POA: Diagnosis not present

## 2020-09-08 DIAGNOSIS — I10 Essential (primary) hypertension: Secondary | ICD-10-CM

## 2020-09-08 DIAGNOSIS — F418 Other specified anxiety disorders: Secondary | ICD-10-CM | POA: Diagnosis not present

## 2020-09-08 LAB — CBC WITH DIFFERENTIAL/PLATELET
Basophils Absolute: 0 10*3/uL (ref 0.0–0.1)
Basophils Relative: 0.8 % (ref 0.0–3.0)
Eosinophils Absolute: 0.1 10*3/uL (ref 0.0–0.7)
Eosinophils Relative: 1.8 % (ref 0.0–5.0)
HCT: 35.8 % — ABNORMAL LOW (ref 36.0–46.0)
Hemoglobin: 11.3 g/dL — ABNORMAL LOW (ref 12.0–15.0)
Lymphocytes Relative: 35.9 % (ref 12.0–46.0)
Lymphs Abs: 1.9 10*3/uL (ref 0.7–4.0)
MCHC: 31.4 g/dL (ref 30.0–36.0)
MCV: 82.1 fl (ref 78.0–100.0)
Monocytes Absolute: 0.5 10*3/uL (ref 0.1–1.0)
Monocytes Relative: 8.7 % (ref 3.0–12.0)
Neutro Abs: 2.9 10*3/uL (ref 1.4–7.7)
Neutrophils Relative %: 52.8 % (ref 43.0–77.0)
Platelets: 299 10*3/uL (ref 150.0–400.0)
RBC: 4.36 Mil/uL (ref 3.87–5.11)
RDW: 14.8 % (ref 11.5–15.5)
WBC: 5.4 10*3/uL (ref 4.0–10.5)

## 2020-09-08 LAB — HEPATIC FUNCTION PANEL
ALT: 10 U/L (ref 0–35)
AST: 13 U/L (ref 0–37)
Albumin: 4 g/dL (ref 3.5–5.2)
Alkaline Phosphatase: 89 U/L (ref 39–117)
Bilirubin, Direct: 0.1 mg/dL (ref 0.0–0.3)
Total Bilirubin: 0.4 mg/dL (ref 0.2–1.2)
Total Protein: 7 g/dL (ref 6.0–8.3)

## 2020-09-08 LAB — LIPID PANEL
Cholesterol: 188 mg/dL (ref 0–200)
HDL: 55.2 mg/dL (ref >39.00)
LDL Cholesterol: 121 mg/dL — ABNORMAL HIGH (ref 0–99)
NonHDL: 132.46
Total CHOL/HDL Ratio: 3
Triglycerides: 56 mg/dL (ref 0.0–149.0)
VLDL: 11.2 mg/dL (ref 0.0–40.0)

## 2020-09-08 LAB — BASIC METABOLIC PANEL
BUN: 16 mg/dL (ref 6–23)
CO2: 30 mEq/L (ref 19–32)
Calcium: 9.4 mg/dL (ref 8.4–10.5)
Chloride: 107 mEq/L (ref 96–112)
Creatinine, Ser: 0.72 mg/dL (ref 0.40–1.20)
GFR: 96.64 mL/min (ref >60.00)
Glucose, Bld: 96 mg/dL (ref 70–99)
Potassium: 4.8 mEq/L (ref 3.5–5.1)
Sodium: 142 mEq/L (ref 135–145)

## 2020-09-08 MED ORDER — HYDROCHLOROTHIAZIDE 25 MG PO TABS: 25.0000 mg | ORAL_TABLET | Freq: Every day | ORAL | 3 refills | Status: DC

## 2020-09-08 MED ORDER — FLUOXETINE HCL 20 MG PO TABS: 20.0000 mg | ORAL_TABLET | Freq: Every day | ORAL | 3 refills | Status: DC

## 2020-09-08 MED ORDER — METOPROLOL SUCCINATE ER 100 MG PO TB24: 100.0000 mg | ORAL_TABLET | Freq: Every day | ORAL | 3 refills | Status: DC

## 2020-09-08 MED ORDER — FAMOTIDINE 40 MG PO TABS: 40.0000 mg | ORAL_TABLET | Freq: Every day | ORAL | 3 refills | Status: AC

## 2020-09-08 NOTE — Progress Notes (Signed)
   Subjective:    Patient ID: Beverly Rogers, female    DOB: [birth year], 50 y.o.   MRN: [id number]  HPI Here for several issues. First her stress levels have been very high, and she says her job os the main reason why. She has worked there for 12 years and it becomes more and more stressful to her all the time. She has been looking for something else but has not been able to find anything as yet. She cries a lot, she worries about things, and she cannot sleep. She takes Lorazepam sometimes, but she cannot take this at work because it is too sedating. Her BP has also been sky high at home, often 974-163 systolic. No chest pain or SOB.    Review of Systems  Constitutional: Negative.   Respiratory: Negative.   Cardiovascular: Negative.   Neurological: Negative.   Psychiatric/Behavioral: Positive for decreased concentration, dysphoric mood and sleep disturbance. Negative for agitation, behavioral problems and confusion. The patient is nervous/anxious.        Objective:   Physical Exam Constitutional:      Appearance: Normal appearance.  Cardiovascular:     Rate and Rhythm: Normal rate and regular rhythm.     Pulses: Normal pulses.     Heart sounds: Normal heart sounds.  Pulmonary:     Effort: Pulmonary effort is normal.     Breath sounds: Normal breath sounds.  Musculoskeletal:     Right lower leg: No edema.     Left lower leg: No edema.  Neurological:     General: No focal deficit present.     Mental Status: She is alert and oriented to person, place, and time.  Psychiatric:        Behavior: Behavior normal.        Thought Content: Thought content normal.        Judgment: Judgment normal.     Comments: Anxious and tearful            Assessment & Plan:  For her depression and anxiety, we will start her on Prozac 20 mg daily. She can still use Lorazepam if needed. We will write her out of work until 09-27-20. For the HTN, we will stop Lasix and start her on HCTZ 25 mg daily.  Also increase the Metoprolol succinate to 100 mg daily. Get labs including renal function. Follow up here in 2 weeks.  Alysia Penna, MD

## 2020-09-09 LAB — T3, FREE: T3, Free: 3.6 pg/mL (ref 2.3–4.2)

## 2020-09-09 LAB — T4, FREE: Free T4: 0.93 ng/dL (ref 0.60–1.60)

## 2020-09-09 LAB — TSH: TSH: 2.23 u[IU]/mL (ref 0.35–4.50)

## 2020-09-10 ENCOUNTER — Telehealth: Payer: Self-pay | Admitting: Family Medicine

## 2020-09-10 NOTE — Telephone Encounter (Signed)
[place] faxed FMLA forms  Fax to: [phone]  Disposition: Dr's [employer]

## 2020-09-13 NOTE — Telephone Encounter (Signed)
The form is ready  

## 2020-09-14 NOTE — Telephone Encounter (Signed)
Spoke with pt aware that her FMLA form was faxed as requested, pt state that she will pick up the copy in the office

## 2020-09-22 ENCOUNTER — Other Ambulatory Visit: Payer: Self-pay

## 2020-09-22 ENCOUNTER — Ambulatory Visit: Payer: No Typology Code available for payment source | Admitting: Family Medicine

## 2020-09-22 ENCOUNTER — Encounter: Payer: Self-pay | Admitting: Family Medicine

## 2020-09-22 VITALS — BP 138/98 | HR 57 | Temp 98.4°F | Wt 196.0 lb

## 2020-09-22 DIAGNOSIS — I10 Essential (primary) hypertension: Secondary | ICD-10-CM | POA: Diagnosis not present

## 2020-09-22 DIAGNOSIS — F418 Other specified anxiety disorders: Secondary | ICD-10-CM | POA: Diagnosis not present

## 2020-09-22 MED ORDER — AMLODIPINE BESYLATE 5 MG PO TABS: 5.0000 mg | ORAL_TABLET | Freq: Every day | ORAL | 3 refills | Status: DC

## 2020-09-22 NOTE — Progress Notes (Signed)
   Subjective:    Patient ID: Beverly Rogers, female    DOB: [birth year], 50 y.o.   MRN: [id number]  HPI Here to follow up on HTN and depression with anxiety. She has ben on leave from work while we get these issues under control. She is currently taking Metoprolol succinate 100 mg daily and HCTZ 25 mg daily. Her BP has come down a bit, but she still gets readings at home of 140-160 over 90-100. She is taking Prozac 20 mg daily, and this has been very helpful. She feels more relaxed and she is sleeping better.     Review of Systems  Constitutional: Negative.   Respiratory: Negative.   Cardiovascular: Negative.   Neurological: Negative.   Psychiatric/Behavioral: Positive for decreased concentration. Negative for agitation, behavioral problems, confusion, dysphoric mood and hallucinations. The patient is nervous/anxious.        Objective:   Physical Exam Constitutional:      Appearance: Normal appearance.  Cardiovascular:     Rate and Rhythm: Normal rate and regular rhythm.     Pulses: Normal pulses.     Heart sounds: Normal heart sounds.  Pulmonary:     Effort: Pulmonary effort is normal.     Breath sounds: Normal breath sounds.  Neurological:     General: No focal deficit present.     Mental Status: She is alert and oriented to person, place, and time.  Psychiatric:        Mood and Affect: Mood normal.        Behavior: Behavior normal.        Thought Content: Thought content normal.        Judgment: Judgment normal.           Assessment & Plan:  For the HTN, we will add Amlodipine 5 mg daily. Her depression with anxiety is much better controlled, and we agreed to leave the Prozac as it is. She will return to work as we had planned on 09-27-20.  Alysia Penna, MD

## 2020-09-23 ENCOUNTER — Ambulatory Visit: Payer: No Typology Code available for payment source | Admitting: Family Medicine

## 2020-09-30 ENCOUNTER — Other Ambulatory Visit: Payer: Self-pay | Admitting: Family Medicine

## 2020-11-01 ENCOUNTER — Encounter: Payer: Self-pay | Admitting: Family Medicine

## 2020-11-02 ENCOUNTER — Other Ambulatory Visit: Payer: Self-pay

## 2020-11-02 MED ORDER — FLUOXETINE HCL 40 MG PO CAPS: 40.0000 mg | ORAL_CAPSULE | Freq: Every day | ORAL | 3 refills | Status: DC

## 2020-11-02 NOTE — Telephone Encounter (Signed)
We will increase the Prozac to 40 mg daily. Call in #90 with 3 rf

## 2020-11-17 ENCOUNTER — Other Ambulatory Visit: Payer: Self-pay | Admitting: Gastroenterology

## 2020-12-20 ENCOUNTER — Other Ambulatory Visit: Payer: Self-pay | Admitting: Family Medicine

## 2020-12-27 ENCOUNTER — Ambulatory Visit (INDEPENDENT_AMBULATORY_CARE_PROVIDER_SITE_OTHER): Payer: No Typology Code available for payment source | Admitting: Podiatry

## 2020-12-27 ENCOUNTER — Other Ambulatory Visit: Payer: Self-pay

## 2020-12-27 DIAGNOSIS — L308 Other specified dermatitis: Secondary | ICD-10-CM

## 2020-12-27 NOTE — Progress Notes (Signed)
   HPI: 50 y.o. female presenting today for new complaint regarding blister lesions that are very itchy to the bilateral feet.  She states that she will occasionally get them on her feet and her hands.  She does have a history of eczema.  Patient states that a few weeks ago her son was graduating from high school and she was very stressed and it may be related to her stress.  She presents for further treatment and evaluation  Past Medical History:  Diagnosis Date   Allergy    Anemia    "many years ago"   Arthritis    Depression    post partum   Hx of appendectomy    Hypertension    IBS (irritable bowel syndrome)    Migraine    Vitamin D deficiency      Physical Exam: General: The patient is alert and oriented x3 in no acute distress.  Dermatology: Skin is warm, dry and supple bilateral lower extremities.  Left foot medial longitudinal arch demonstrates small vesicular lesions that appear to have dried and are resolving on their own.  No pruritus noted  Vascular: Palpable pedal pulses bilaterally. No edema or erythema noted. Capillary refill within normal limits.  Neurological: Epicritic and protective threshold grossly intact bilaterally.   Musculoskeletal Exam: No pedal deformities noted  Assessment: 1.  Vesicular dermatitis bilateral feet; stress-induced   Plan of Care:  1. Patient evaluated.  2.  Patient states that she has a prescription for Kenalog ointment that she applies to the eczema flareups.  Recommend that she applies to the same lesions and she does have an occasional flareup 3.  Continue wearing good supportive shoes and sandals 4.  Return to clinic as needed      Edrick Kins, DPM [facility]  Dr. Edrick Kins, DPM    [address], [place] [address]                Office [phone]  Fax [phone]

## 2021-01-05 ENCOUNTER — Other Ambulatory Visit: Payer: Self-pay | Admitting: Family Medicine

## 2021-02-28 ENCOUNTER — Other Ambulatory Visit: Payer: Self-pay

## 2021-03-01 ENCOUNTER — Ambulatory Visit: Payer: No Typology Code available for payment source | Admitting: Family Medicine

## 2021-03-01 ENCOUNTER — Encounter: Payer: Self-pay | Admitting: Family Medicine

## 2021-03-01 VITALS — BP 128/78 | HR 56 | Temp 98.6°F | Wt 212.0 lb

## 2021-03-01 DIAGNOSIS — D649 Anemia, unspecified: Secondary | ICD-10-CM

## 2021-03-01 LAB — CBC WITH DIFFERENTIAL/PLATELET
Basophils Absolute: 0 10*3/uL (ref 0.0–0.1)
Basophils Relative: 0.5 % (ref 0.0–3.0)
Eosinophils Absolute: 0.2 10*3/uL (ref 0.0–0.7)
Eosinophils Relative: 3.4 % (ref 0.0–5.0)
HCT: 38.6 % (ref 36.0–46.0)
Hemoglobin: 11.9 g/dL — ABNORMAL LOW (ref 12.0–15.0)
Lymphocytes Relative: 45.5 % (ref 12.0–46.0)
Lymphs Abs: 2.6 10*3/uL (ref 0.7–4.0)
MCHC: 30.9 g/dL (ref 30.0–36.0)
MCV: 77.9 fl — ABNORMAL LOW (ref 78.0–100.0)
Monocytes Absolute: 0.6 10*3/uL (ref 0.1–1.0)
Monocytes Relative: 10.4 % (ref 3.0–12.0)
Neutro Abs: 2.3 10*3/uL (ref 1.4–7.7)
Neutrophils Relative %: 40.2 % — ABNORMAL LOW (ref 43.0–77.0)
Platelets: 301 10*3/uL (ref 150.0–400.0)
RBC: 4.95 Mil/uL (ref 3.87–5.11)
RDW: 18.6 % — ABNORMAL HIGH (ref 11.5–15.5)
WBC: 5.8 10*3/uL (ref 4.0–10.5)

## 2021-03-01 NOTE — Progress Notes (Signed)
   Subjective:    Patient ID: Beverly Rogers, female    DOB: May 31, 1971, 50 y.o.   MRN: [id number]  HPI Here to address her mild anemia. She has had a slight anemia off and on for years, but it has never been worked up. When she was here for a wellness exam on 09-08-20 her Hgb was 11.3 with normal indices. She no longer has menses. No apparent blood losses in the stools. She takes Omeprazole only as needed. She does not take vitamins or supplements, and she is not a vegetarian. She has been donating blood for years to the [facility], but they turned her down last week because her Hgb was again 11.3.    Review of Systems  Constitutional: Negative.   Respiratory: Negative.    Cardiovascular: Negative.       Objective:   Physical Exam Constitutional:      Appearance: Normal appearance.  Cardiovascular:     Rate and Rhythm: Normal rate and regular rhythm.     Pulses: Normal pulses.     Heart sounds: Normal heart sounds.  Pulmonary:     Effort: Pulmonary effort is normal.     Breath sounds: Normal breath sounds.  Neurological:     Mental Status: She is alert.          Assessment & Plan:  Anemia. We will repeat a CBC today and also check iron, ferritin, folate and B12.  Alysia Penna, MD

## 2021-03-02 LAB — VITAMIN B12: Vitamin B-12: 344 pg/mL (ref 211–911)

## 2021-03-02 LAB — IBC + FERRITIN
Ferritin: 11.6 ng/mL (ref 10.0–291.0)
Iron: 92 ug/dL (ref 42–145)
Saturation Ratios: 18.9 % — ABNORMAL LOW (ref 20.0–50.0)
TIBC: 487.2 ug/dL — ABNORMAL HIGH (ref 250.0–450.0)
Transferrin: 348 mg/dL (ref 212.0–360.0)

## 2021-03-02 MED ORDER — IRON (FERROUS SULFATE) 325 (65 FE) MG PO TABS: 325.0000 mg | ORAL_TABLET | Freq: Every day | ORAL | 3 refills | Status: DC

## 2021-03-02 NOTE — Addendum Note (Signed)
Addended by: Nilda Riggs on: 03/02/2021 12:22 PM   Modules accepted: Orders

## 2021-03-21 ENCOUNTER — Other Ambulatory Visit: Payer: Self-pay

## 2021-03-22 ENCOUNTER — Encounter: Payer: Self-pay | Admitting: Family Medicine

## 2021-03-22 ENCOUNTER — Ambulatory Visit (INDEPENDENT_AMBULATORY_CARE_PROVIDER_SITE_OTHER)
Admission: RE | Admit: 2021-03-22 | Discharge: 2021-03-22 | Disposition: A | Discharge status: Home / Self Care | Payer: No Typology Code available for payment source | Source: Ambulatory Visit | Attending: Family Medicine | Admitting: Family Medicine

## 2021-03-22 ENCOUNTER — Ambulatory Visit: Payer: No Typology Code available for payment source | Admitting: Family Medicine

## 2021-03-22 VITALS — BP 130/92 | HR 71 | Temp 98.9°F | Wt 218.0 lb

## 2021-03-22 DIAGNOSIS — M5442 Lumbago with sciatica, left side: Secondary | ICD-10-CM | POA: Diagnosis not present

## 2021-03-22 MED ORDER — METHYLPREDNISOLONE 4 MG PO TBPK: ORAL_TABLET | ORAL | 0 refills | Status: DC

## 2021-03-22 NOTE — Progress Notes (Signed)
   Subjective:    Patient ID: Beverly Rogers, female    DOB: [birth year], 50 y.o.   MRN: [id number]  HPI Here for the sudden onset of left sided low back pain about 2 weeks ago. No recent trauma. She woke up with this pain one morning. The pain runs into the left buttock. She also feels a pins and needles sensation down the left leg. She has tried heat, Flexeril, and ES Tylenol with little relief. Sitting males the pain worse, and standing makes it better.    Review of Systems  Constitutional: Negative.   Respiratory: Negative.    Cardiovascular: Negative.   Musculoskeletal:  Positive for back pain.      Objective:   Physical Exam Constitutional:      Comments: She has pain with getting on the exam table   Cardiovascular:     Rate and Rhythm: Normal rate and regular rhythm.     Pulses: Normal pulses.     Heart sounds: Normal heart sounds.  Pulmonary:     Effort: Pulmonary effort is normal.     Breath sounds: Normal breath sounds.  Musculoskeletal:     Comments: She is tender directly over the spine in the upper lumbar area as well as to the left of the lower spine. She is tender over the left sciatic notch. ROM is full. SLR are negative   Neurological:     Mental Status: She is alert.          Assessment & Plan:  Low back pain with left sciatica, we will treat this with a Medrol dose pack. Get lumbar spine Xrays today.  Alysia Penna, MD

## 2021-05-02 ENCOUNTER — Other Ambulatory Visit: Payer: Self-pay

## 2021-05-02 ENCOUNTER — Ambulatory Visit (INDEPENDENT_AMBULATORY_CARE_PROVIDER_SITE_OTHER): Payer: No Typology Code available for payment source

## 2021-05-02 DIAGNOSIS — Z23 Encounter for immunization: Secondary | ICD-10-CM | POA: Diagnosis not present

## 2021-05-16 ENCOUNTER — Encounter: Payer: Self-pay | Admitting: Family Medicine

## 2021-05-16 ENCOUNTER — Telehealth (INDEPENDENT_AMBULATORY_CARE_PROVIDER_SITE_OTHER): Payer: No Typology Code available for payment source | Admitting: Family Medicine

## 2021-05-16 VITALS — Wt 212.0 lb

## 2021-05-16 DIAGNOSIS — U071 COVID-19: Secondary | ICD-10-CM | POA: Diagnosis not present

## 2021-05-16 DIAGNOSIS — R051 Acute cough: Secondary | ICD-10-CM

## 2021-05-16 MED ORDER — HYDROCODONE BIT-HOMATROP MBR 5-1.5 MG/5ML PO SOLN: 5.0000 mL | Freq: Four times a day (QID) | ORAL | 0 refills | Status: DC | PRN

## 2021-05-16 MED ORDER — BENZONATATE 100 MG PO CAPS: 100.0000 mg | ORAL_CAPSULE | Freq: Three times a day (TID) | ORAL | 2 refills | Status: DC | PRN

## 2021-05-16 NOTE — Progress Notes (Signed)
Virtual Visit via Video Note  I connected with Beverly Rogers  on 05/16/21 at 10:00 AM EST by a video enabled telemedicine application and verified that I am speaking with the correct person using two identifiers.  Location patient: home Location provider: [place], [place] [id number] Persons participating in the virtual visit: patient, provider  I discussed the limitations of evaluation and management by telemedicine and the availability of in person appointments. The patient expressed understanding and agreed to proceed.   Beverly Rogers DOB: 1970/07/16 Encounter date: 05/16/2021  This is a 50 y.o. female who presents with Chief Complaint  Patient presents with   Results    Patient states the home Covid test was positive 4 days ago   Fatigue    X1 week   Cough    Non-productive x1 week, productive x2 days, currently non-productive, tried Copywriter, advertising and Nyquil with no relief   Sore Throat    X6 days, tried Ricola cough drops    History of present illness:  See above. No fevers. Does feel like cough is getting worse, esp at night. Not had covid before. Had 3 vaccines.   No history of prolonged cough with illness.   Had sinus pressure until yesterday. Ears were clogged, but this is gone today.   No nausea, vomiting, diarrhea.   Eating and drinking ok.   Coughing spells - productive a little at the end. Honey ricola drops seems to help the most.   At first felt like there was post nasal drainage that was contributing to evening cough.    Allergies  Allergen Reactions   Bactrim [Sulfamethoxazole-Trimethoprim] Hives   Ciprofloxacin    Lisinopril Cough   Current Meds  Medication Sig   amLODipine (NORVASC) 5 MG tablet TAKE 1 TABLET (5 MG TOTAL) BY MOUTH DAILY.   benzonatate (TESSALON PERLES) 100 MG capsule Take 1 capsule (100 mg total) by mouth 3 (three) times daily as needed for cough.   cyclobenzaprine (FLEXERIL) 10 MG  tablet TAKE 1 TABLET BY MOUTH THREE TIMES A DAY AS NEEDED FOR MUSCLE SPASMS   estradiol (CLIMARA - DOSED IN MG/24 HR) 0.025 mg/24hr patch Place 1 patch (0.025 mg total) onto the skin 2 (two) times a week.   famotidine (PEPCID) 40 MG tablet Take 1 tablet (40 mg total) by mouth at bedtime.   FLUoxetine (PROZAC) 40 MG capsule Take 1 capsule (40 mg total) by mouth daily.   halobetasol (ULTRAVATE) 0.05 % cream Apply topically 2 (two) times daily as needed. For eczema   hydrochlorothiazide (HYDRODIURIL) 25 MG tablet Take 1 tablet (25 mg total) by mouth daily.   HYDROcodone bit-homatropine (HYDROMET) 5-1.5 MG/5ML syrup Take 5 mLs by mouth every 6 (six) hours as needed for cough.   Iron, Ferrous Sulfate, 325 (65 Fe) MG TABS Take 325 mg by mouth daily.   ketoconazole (NIZORAL) 2 % cream Apply 1 application topically 2 (two) times daily as needed for irritation (yeast infection).   linaclotide (LINZESS) 290 MCG CAPS capsule Take 1 capsule (290 mcg total) by mouth daily before breakfast. Please schedule a yearly follow up visit for further refills. Thank you.   LORazepam (ATIVAN) 0.5 MG tablet TAKE 1 TABLET BY MOUTH EVERY 8 HOURS AS NEEDED FOR ANXIETY.   metoprolol succinate (TOPROL-XL) 100 MG 24 hr tablet Take 1 tablet (100 mg total) by mouth daily. Take with or immediately following a meal.   omeprazole (PRILOSEC) 40 MG capsule TAKE  1 CAPSULE BY MOUTH EVERY DAY   rizatriptan (MAXALT) 10 MG tablet TAKE 1 TABLET BY MOUTH AS NEEDED FOR MIGAINE. MAY REPEAT IN 2 HOURS IF NEEDED   zolpidem (AMBIEN CR) 6.25 MG CR tablet Take 6.25 mg by mouth at bedtime as needed for sleep.    Review of Systems  Constitutional:  Negative for chills and fever.  HENT:  Positive for congestion, postnasal drip, sinus pressure (improved) and sore throat. Negative for ear pain and sinus pain (improved).   Respiratory:  Positive for cough. Negative for shortness of breath and wheezing.   Cardiovascular:  Negative for chest pain.    Objective:  Wt 212 lb (96.2 kg)   LMP 09/29/2010   BMI 35.28 kg/m   Weight: 212 lb (96.2 kg)   BP Readings from Last 3 Encounters:  03/22/21 (!) 130/92  03/01/21 128/78  09/22/20 (!) 138/98   Wt Readings from Last 3 Encounters:  05/16/21 212 lb (96.2 kg)  03/22/21 218 lb (98.9 kg)  03/01/21 212 lb (96.2 kg)    EXAM:  GENERAL: alert, oriented. Appears well, but is coughing frequently.   HEENT: atraumatic, conjunctiva clear, no obvious abnormalities on inspection of external nose and ears  NECK: normal movements of the head and neck  LUNGS: on inspection no signs of respiratory distress, breathing rate appears normal, no obvious gross SOB. Coughing spell at beginning of visit - hacking, mildly productive. Otherwise able to talk without cough through visit (with cough drop)  CV: no obvious cyanosis  MS: moves all visible extremities without noticeable abnormality  PSYCH/NEURO: pleasant and cooperative, no obvious depression or anxiety, speech and thought processing grossly intact   Assessment/Plan  1. COVID Discussed some of treatment options. She is fully vaccinated. Due to timeline, not sure she would benefit from antiviral treatment at this point, so will treat symptomatically and I have sent her mychart message to reach back out if symptoms are not improving or if any worsening. Increase water intake, add mucinex. Tessalon perles for cough. Did give hydromet which she has had in past for cough. Discussed isolation and retesting at home.   2. Acute cough See above   Return if symptoms worsen or fail to improve.    I discussed the assessment and treatment plan with the patient. The patient was provided an opportunity to ask questions and all were answered. The patient agreed with the plan and demonstrated an understanding of the instructions.   The patient was advised to call back or seek an in-person evaluation if the symptoms worsen or if the condition fails to  improve as anticipated.  I provided 25 minutes of face-to-face time during this encounter.   Beverly Rough, MD

## 2021-05-21 ENCOUNTER — Other Ambulatory Visit: Payer: Self-pay | Admitting: Family Medicine

## 2021-06-07 ENCOUNTER — Ambulatory Visit: Payer: No Typology Code available for payment source | Admitting: Family Medicine

## 2021-06-07 VITALS — BP 116/90 | HR 61 | Temp 98.2°F | Wt 214.7 lb

## 2021-06-07 DIAGNOSIS — R062 Wheezing: Secondary | ICD-10-CM | POA: Diagnosis not present

## 2021-06-07 DIAGNOSIS — U071 COVID-19: Secondary | ICD-10-CM | POA: Diagnosis not present

## 2021-06-07 MED ORDER — ALBUTEROL SULFATE HFA 108 (90 BASE) MCG/ACT IN AERS: 2.0000 | INHALATION_SPRAY | RESPIRATORY_TRACT | 0 refills | Status: DC | PRN

## 2021-06-07 MED ORDER — PREDNISONE 20 MG PO TABS: ORAL_TABLET | ORAL | 0 refills | Status: DC

## 2021-06-07 NOTE — Progress Notes (Signed)
Established Patient Office Visit  Subjective:  Patient ID: Beverly Rogers, female    DOB: 11-17-1970  Age: 50 y.o. MRN: [id number]  CC:  Chief Complaint  Patient presents with   Fatigue    Post COVID, still having SOB, dry cough, fatigue, and feeling like ears are stopped or like theres air in them    HPI Beverly Rogers presents for fatigue and some shortness of breath and possible intermittent wheezing following COVID infection which was diagnosed November 3.  She still has some cough.  She also states that her both her ears feel "clogged up ".  She thinks she is having some postnasal drip.  No chest pains.  No pleuritic pain.  Appetite is fair.  Denies any fever.  No history of asthma.  No leg edema or calf pain.  Past Medical History:  Diagnosis Date   Allergy    Anemia    "many years ago"   Arthritis    Depression    post partum   Hx of appendectomy    Hypertension    IBS (irritable bowel syndrome)    Migraine    Vitamin D deficiency     Past Surgical History:  Procedure Laterality Date   APPENDECTOMY     FRACTURE SURGERY     repair in the rt lower leg screwsand plate in place   PARTIAL HYSTERECTOMY  11/16/2010    Family History  Problem Relation Age of Onset   Colon cancer Neg Hx    Esophageal cancer Neg Hx    Rectal cancer Neg Hx    Stomach cancer Neg Hx     Social History   Socioeconomic History   Marital status: Divorced    Spouse name: Not on file   Number of children: Not on file   Years of education: Not on file   Highest education level: Not on file  Occupational History   Occupation: travel Surveyor, mining: [employer]  Tobacco Use   Smoking status: Never   Smokeless tobacco: Never  Vaping Use   Vaping Use: Never used  Substance and Sexual Activity   Alcohol use: No    Alcohol/week: 0.0 standard drinks   Drug use: No   Sexual activity: Not on file  Other Topics Concern   Not on file  Social History Narrative   Not  on file   Social Determinants of Health   Financial Resource Strain: Not on file  Food Insecurity: Not on file  Transportation Needs: Not on file  Physical Activity: Not on file  Stress: Not on file  Social Connections: Not on file  Intimate Partner Violence: Not on file    Outpatient Medications Prior to Visit  Medication Sig Dispense Refill   amLODipine (NORVASC) 5 MG tablet TAKE 1 TABLET (5 MG TOTAL) BY MOUTH DAILY. 90 tablet 1   benzonatate (TESSALON PERLES) 100 MG capsule Take 1 capsule (100 mg total) by mouth 3 (three) times daily as needed for cough. 30 capsule 2   cyclobenzaprine (FLEXERIL) 10 MG tablet TAKE 1 TABLET BY MOUTH THREE TIMES A DAY AS NEEDED FOR MUSCLE SPASMS 90 tablet 5   estradiol (CLIMARA - DOSED IN MG/24 HR) 0.025 mg/24hr patch Place 1 patch (0.025 mg total) onto the skin 2 (two) times a week. 4 patch 12   famotidine (PEPCID) 40 MG tablet Take 1 tablet (40 mg total) by mouth at bedtime. 90 tablet 3   FLUoxetine (PROZAC) 40 MG capsule Take  1 capsule (40 mg total) by mouth daily. 90 capsule 3   halobetasol (ULTRAVATE) 0.05 % cream Apply topically 2 (two) times daily as needed. For eczema 50 g 5   hydrochlorothiazide (HYDRODIURIL) 25 MG tablet Take 1 tablet (25 mg total) by mouth daily. 90 tablet 3   HYDROcodone bit-homatropine (HYDROMET) 5-1.5 MG/5ML syrup Take 5 mLs by mouth every 6 (six) hours as needed for cough. 120 mL 0   Iron, Ferrous Sulfate, 325 (65 Fe) MG TABS Take 325 mg by mouth daily. 90 tablet 3   ketoconazole (NIZORAL) 2 % cream Apply 1 application topically 2 (two) times daily as needed for irritation (yeast infection). 30 g 5   linaclotide (LINZESS) 290 MCG CAPS capsule Take 1 capsule (290 mcg total) by mouth daily before breakfast. Please schedule a yearly follow up visit for further refills. Thank you. 30 capsule 1   LORazepam (ATIVAN) 0.5 MG tablet TAKE 1 TABLET BY MOUTH EVERY 8 HOURS AS NEEDED FOR ANXIETY. 90 tablet 5   metoprolol succinate  (TOPROL-XL) 100 MG 24 hr tablet Take 1 tablet (100 mg total) by mouth daily. Take with or immediately following a meal. 90 tablet 3   omeprazole (PRILOSEC) 40 MG capsule TAKE 1 CAPSULE BY MOUTH EVERY DAY 90 capsule 0   rizatriptan (MAXALT) 10 MG tablet TAKE 1 TABLET BY MOUTH AS NEEDED FOR MIGAINE. MAY REPEAT IN 2 HOURS IF NEEDED 10 tablet 11   zolpidem (AMBIEN CR) 6.25 MG CR tablet Take 6.25 mg by mouth at bedtime as needed for sleep.     promethazine (PHENERGAN) 25 MG tablet TAKE 1 TABLET (25 MG TOTAL) BY MOUTH EVERY 4 (FOUR) HOURS AS NEEDED FOR UP TO 7 DAYS FOR NAUSEA. 60 tablet 5   No facility-administered medications prior to visit.    Allergies  Allergen Reactions   Bactrim [Sulfamethoxazole-Trimethoprim] Hives   Ciprofloxacin    Lisinopril Cough    ROS Review of Systems  Constitutional:  Positive for fatigue. Negative for chills and fever.  Respiratory:  Positive for cough, shortness of breath and wheezing.   Cardiovascular:  Negative for chest pain and leg swelling.     Objective:    Physical Exam Vitals reviewed.  HENT:     Right Ear: Tympanic membrane and external ear normal.     Left Ear: Tympanic membrane and external ear normal.  Cardiovascular:     Rate and Rhythm: Normal rate and regular rhythm.  Pulmonary:     Effort: Pulmonary effort is normal. No respiratory distress.     Breath sounds: Wheezing present. No rales.  Musculoskeletal:     Right lower leg: No edema.     Left lower leg: No edema.  Neurological:     Mental Status: She is alert.    BP 116/90 (BP Location: Left Arm, Patient Position: Sitting, Cuff Size: Large)   Pulse 61   Temp 98.2 F (36.8 C) (Oral)   Wt 214 lb 11.2 oz (97.4 kg)   LMP 09/29/2010   SpO2 97%   BMI 35.73 kg/m  Wt Readings from Last 3 Encounters:  06/07/21 214 lb 11.2 oz (97.4 kg)  05/16/21 212 lb (96.2 kg)  03/22/21 218 lb (98.9 kg)     Health Maintenance Due  Topic Date Due   HIV Screening  Never done   Hepatitis  C Screening  Never done   PAP SMEAR-Modifier  Never done   COLONOSCOPY (Pts 45-58yrs Insurance coverage will need to be confirmed)  Never done  Zoster Vaccines- Shingrix (1 of 2) Never done   COVID-19 Vaccine (4 - Booster for Pfizer series) 08/20/2020    There are no preventive care reminders to display for this patient.  Lab Results  Component Value Date   TSH 2.23 09/08/2020   Lab Results  Component Value Date   WBC 5.8 03/01/2021   HGB 11.9 (L) 03/01/2021   HCT 38.6 03/01/2021   MCV 77.9 (L) 03/01/2021   PLT 301.0 03/01/2021   Lab Results  Component Value Date   NA 142 09/08/2020   K 4.8 09/08/2020   CO2 30 09/08/2020   GLUCOSE 96 09/08/2020   BUN 16 09/08/2020   CREATININE 0.72 09/08/2020   BILITOT 0.4 09/08/2020   ALKPHOS 89 09/08/2020   AST 13 09/08/2020   ALT 10 09/08/2020   PROT 7.0 09/08/2020   ALBUMIN 4.0 09/08/2020   CALCIUM 9.4 09/08/2020   GFR 96.64 09/08/2020   Lab Results  Component Value Date   CHOL 188 09/08/2020   Lab Results  Component Value Date   HDL 55.20 09/08/2020   Lab Results  Component Value Date   LDLCALC 121 (H) 09/08/2020   Lab Results  Component Value Date   TRIG 56.0 09/08/2020   Lab Results  Component Value Date   CHOLHDL 3 09/08/2020   No results found for: HGBA1C    Assessment & Plan:   Patient presents with recent COVID-19 infection about 4 weeks ago.  She has evidence for some wheezing on exam but is in no respiratory distress.  Pulse oximetry 97%.  -Recommend prednisone 20 mg 2 tablets daily for 5 days -Albuterol MDI 2 puffs every 4-6 hours as needed for cough and wheeze -Follow-up for any persistent cough, fever, or other concerns  Meds ordered this encounter  Medications   predniSONE (DELTASONE) 20 MG tablet    Sig: Take two tablets by mouth once daily for 5 days    Dispense:  10 tablet    Refill:  0   albuterol (PROVENTIL HFA) 108 (90 Base) MCG/ACT inhaler    Sig: Inhale 2 puffs into the lungs every  4 (four) hours as needed for wheezing or shortness of breath.    Dispense:  8 g    Refill:  0    Follow-up: No follow-ups on file.    Carolann Littler, MD

## 2021-06-20 ENCOUNTER — Ambulatory Visit: Payer: No Typology Code available for payment source | Admitting: Family Medicine

## 2021-06-21 ENCOUNTER — Encounter: Payer: Self-pay | Admitting: Family Medicine

## 2021-06-21 ENCOUNTER — Ambulatory Visit: Payer: No Typology Code available for payment source | Admitting: Family Medicine

## 2021-06-21 ENCOUNTER — Ambulatory Visit (INDEPENDENT_AMBULATORY_CARE_PROVIDER_SITE_OTHER): Payer: No Typology Code available for payment source

## 2021-06-21 ENCOUNTER — Other Ambulatory Visit: Payer: Self-pay

## 2021-06-21 VITALS — BP 140/110 | HR 78 | Temp 98.6°F | Wt 212.0 lb

## 2021-06-21 DIAGNOSIS — J9801 Acute bronchospasm: Secondary | ICD-10-CM

## 2021-06-21 DIAGNOSIS — R052 Subacute cough: Secondary | ICD-10-CM

## 2021-06-21 DIAGNOSIS — U071 COVID-19: Secondary | ICD-10-CM | POA: Diagnosis not present

## 2021-06-21 DIAGNOSIS — H6983 Other specified disorders of Eustachian tube, bilateral: Secondary | ICD-10-CM

## 2021-06-21 NOTE — Progress Notes (Signed)
° °  Subjective:    Patient ID: Beverly Rogers, female    DOB: 03/09/1971, 50 y.o.   MRN: [id number]  HPI Here for symptoms she has had since she tested positive for the Covid-19 virus on 05-12-21. The symptoms started that same day with headaches, SOB and a dry cough. She also had numbness in the right arm from the shoulder down to the hand and a sensation of wind blowing in both ears. No ear pain. No ST or fever. No body aches. She was seen in a virtual visit on 05-16-21 and was given Benzonatate and some cough syrup. She did not improve so she was seen here in the office on 06-07-21 when some wheezes were heard in her chest. She was given a Prednisone taper and an albuterol inhaler. The SOB has gotten better, but she still has frequent coughing. She still has the mild headache as well with ear congestion and right hand numbness.    Review of Systems  Constitutional: Negative.   HENT:  Positive for congestion, hearing loss and sinus pressure. Negative for ear pain, postnasal drip, sinus pain and sore throat.   Eyes: Negative.   Respiratory:  Positive for cough and wheezing. Negative for shortness of breath.   Cardiovascular: Negative.   Neurological:  Positive for numbness. Negative for weakness.      Objective:   Physical Exam Constitutional:      General: She is not in acute distress.    Appearance: Normal appearance.  HENT:     Right Ear: Tympanic membrane, ear canal and external ear normal.     Left Ear: Tympanic membrane, ear canal and external ear normal.     Nose: Nose normal.     Mouth/Throat:     Pharynx: Oropharynx is clear.  Eyes:     Conjunctiva/sclera: Conjunctivae normal.  Cardiovascular:     Rate and Rhythm: Normal rate and regular rhythm.     Pulses: Normal pulses.     Heart sounds: Normal heart sounds.  Pulmonary:     Effort: Pulmonary effort is normal. No respiratory distress.     Breath sounds: No stridor. No rhonchi or rales.     Comments: Soft scattered  wheezes  Lymphadenopathy:     Cervical: No cervical adenopathy.  Neurological:     Mental Status: She is alert.     Comments: The right hand has normal radial pulse, is warm and pink. She has a slightly decreased sensation to light touch in the right hand, but grip is normal. Strength is normal and symmetrical in both arms           Assessment & Plan:  All these symptoms seem to be the results of her recent Covid infection. She has head congestion and blocked eustachian tubes, so I suggested she take Mucinex 1200 mg BID. She has some bronchial tube inflammation so she can use the inhaler as needed. We will send her for a CXR today to rule out pneumonia, etc. Hopefully over time these symptoms will fade away.  Alysia Penna, MD

## 2021-07-08 ENCOUNTER — Encounter: Payer: Self-pay | Admitting: Gastroenterology

## 2021-08-09 ENCOUNTER — Encounter: Payer: Self-pay | Admitting: Family Medicine

## 2021-08-09 ENCOUNTER — Ambulatory Visit: Payer: No Typology Code available for payment source | Admitting: Family Medicine

## 2021-08-09 VITALS — BP 156/100 | HR 92 | Temp 98.9°F | Wt 216.0 lb

## 2021-08-09 DIAGNOSIS — I10 Essential (primary) hypertension: Secondary | ICD-10-CM

## 2021-08-09 MED ORDER — RIZATRIPTAN BENZOATE 10 MG PO TABS: ORAL_TABLET | ORAL | 11 refills | Status: DC

## 2021-08-09 MED ORDER — AMLODIPINE BESYLATE 5 MG PO TABS: 10.0000 mg | ORAL_TABLET | Freq: Every day | ORAL | 1 refills | Status: DC

## 2021-08-09 NOTE — Progress Notes (Signed)
g  Subjective:    Patient ID: Beverly Rogers, female    DOB: [birth year], 51 y.o.   MRN: [id number]  HPI Here for several weeks of lightheadedness and elevated BP. She has been getting readings at home of 150s over 100s, and at the audiology office yesterday it was 160/110. No headaches or chest pains or SOB.    Review of Systems  Constitutional: Negative.   Respiratory: Negative.    Cardiovascular: Negative.   Neurological:  Positive for light-headedness.      Objective:   Physical Exam Constitutional:      Appearance: Normal appearance. She is not ill-appearing.  Cardiovascular:     Rate and Rhythm: Normal rate and regular rhythm.     Pulses: Normal pulses.     Heart sounds: Normal heart sounds.  Pulmonary:     Effort: Pulmonary effort is normal.     Breath sounds: Normal breath sounds.  Musculoskeletal:     Right lower leg: No edema.     Left lower leg: No edema.  Neurological:     General: No focal deficit present.     Mental Status: She is alert. Mental status is at baseline.          Assessment & Plan:  HTN. We will increase the Amlodipine to 10 mg daily. She will report back to [place] in one week. Alysia Penna, MD

## 2021-10-25 ENCOUNTER — Ambulatory Visit: Payer: No Typology Code available for payment source | Admitting: Family Medicine

## 2021-10-25 ENCOUNTER — Encounter: Payer: Self-pay | Admitting: Family Medicine

## 2021-10-25 VITALS — BP 132/88 | HR 63 | Temp 98.8°F | Wt 216.0 lb

## 2021-10-25 DIAGNOSIS — N3281 Overactive bladder: Secondary | ICD-10-CM | POA: Diagnosis not present

## 2021-10-25 DIAGNOSIS — D509 Iron deficiency anemia, unspecified: Secondary | ICD-10-CM | POA: Diagnosis not present

## 2021-10-25 DIAGNOSIS — I10 Essential (primary) hypertension: Secondary | ICD-10-CM | POA: Diagnosis not present

## 2021-10-25 DIAGNOSIS — F418 Other specified anxiety disorders: Secondary | ICD-10-CM | POA: Diagnosis not present

## 2021-10-25 LAB — IBC + FERRITIN
Ferritin: 41.2 ng/mL (ref 10.0–291.0)
Iron: 72 ug/dL (ref 42–145)
Saturation Ratios: 20.1 % (ref 20.0–50.0)
TIBC: 358.4 ug/dL (ref 250.0–450.0)
Transferrin: 256 mg/dL (ref 212.0–360.0)

## 2021-10-25 LAB — CBC WITH DIFFERENTIAL/PLATELET
Basophils Absolute: 0.1 10*3/uL (ref 0.0–0.1)
Basophils Relative: 1.1 % (ref 0.0–3.0)
Eosinophils Absolute: 0.2 10*3/uL (ref 0.0–0.7)
Eosinophils Relative: 4.4 % (ref 0.0–5.0)
HCT: 37.9 % (ref 36.0–46.0)
Hemoglobin: 12.3 g/dL (ref 12.0–15.0)
Lymphocytes Relative: 42.4 % (ref 12.0–46.0)
Lymphs Abs: 2.1 10*3/uL (ref 0.7–4.0)
MCHC: 32.4 g/dL (ref 30.0–36.0)
MCV: 83.1 fl (ref 78.0–100.0)
Monocytes Absolute: 0.5 10*3/uL (ref 0.1–1.0)
Monocytes Relative: 10.4 % (ref 3.0–12.0)
Neutro Abs: 2 10*3/uL (ref 1.4–7.7)
Neutrophils Relative %: 41.7 % — ABNORMAL LOW (ref 43.0–77.0)
Platelets: 271 10*3/uL (ref 150.0–400.0)
RBC: 4.56 Mil/uL (ref 3.87–5.11)
RDW: 14.4 % (ref 11.5–15.5)
WBC: 4.9 10*3/uL (ref 4.0–10.5)

## 2021-10-25 LAB — POC URINALSYSI DIPSTICK (AUTOMATED)
Blood, UA: NEGATIVE
Glucose, UA: NEGATIVE
Ketones, UA: NEGATIVE
Leukocytes, UA: NEGATIVE
Nitrite, UA: NEGATIVE
Protein, UA: POSITIVE — AB
Spec Grav, UA: >=1.03 — AB (ref 1.010–1.025)
Urobilinogen, UA: 0.2 E.U./dL
pH, UA: 5.5 (ref 5.0–8.0)

## 2021-10-25 LAB — IRON: Iron: 72 ug/dL (ref 42–145)

## 2021-10-25 MED ORDER — AMLODIPINE BESYLATE 10 MG PO TABS: 10.0000 mg | ORAL_TABLET | Freq: Every day | ORAL | 3 refills | Status: DC

## 2021-10-25 MED ORDER — SOLIFENACIN SUCCINATE 5 MG PO TABS: 5.0000 mg | ORAL_TABLET | Freq: Every day | ORAL | 5 refills | Status: DC

## 2021-10-25 MED ORDER — FLUOXETINE HCL 40 MG PO CAPS: 40.0000 mg | ORAL_CAPSULE | Freq: Two times a day (BID) | ORAL | 3 refills | Status: DC

## 2021-10-25 NOTE — Progress Notes (Signed)
? ?  Subjective:  ? ? Patient ID: Beverly Rogers, female    DOB: 1970/07/30, 51 y.o.   MRN: [id number] ? ?HPI ?Here for several issues. First we had recently increased her Amlodipine to 10 mg daily, and her BP has been well controlled on this. Second , she has been dealing with more anxiety lately, and she asks if we can increase the dose of the prozac. Third for the past 2 months she has had increased urgency to urinate and this can cause her to has leakage before she has a chance to get to a bathroom. No burning or abdominal pain. Fourth she has been taking one iron pill daily for anemia, and she asks if she still needs to take this.  ? ? ?Review of Systems  ?Constitutional: Negative.   ?Respiratory: Negative.    ?Cardiovascular: Negative.   ?Genitourinary:  Positive for urgency. Negative for dysuria, flank pain and frequency.  ?Psychiatric/Behavioral:  Positive for dysphoric mood. The patient is nervous/anxious.   ? ?   ?Objective:  ? Physical Exam ?Constitutional:   ?   Appearance: Normal appearance.  ?Cardiovascular:  ?   Rate and Rhythm: Normal rate and regular rhythm.  ?   Pulses: Normal pulses.  ?   Heart sounds: Normal heart sounds.  ?Pulmonary:  ?   Effort: Pulmonary effort is normal.  ?   Breath sounds: Normal breath sounds.  ?Neurological:  ?   General: No focal deficit present.  ?   Mental Status: She is alert and oriented to person, place, and time.  ?Psychiatric:     ?   Mood and Affect: Mood normal.     ?   Behavior: Behavior normal.     ?   Thought Content: Thought content normal.  ? ? ? ? ? ?   ?Assessment & Plan:  ?Her HTN is well controlled, so we will switch to taking a single 10 mg pill of Amlodipine daily. Second for the anxiety and depression we will increase the Prozac to 40 mg BID. Third she has OAB, and she will try Vesicare 5 mg daily for this. Fourth for the anemia, we will check iron, ferritin, and a CBC today.  ?Alysia Penna, MD ? ? ?

## 2021-11-03 ENCOUNTER — Other Ambulatory Visit: Payer: Self-pay | Admitting: Family Medicine

## 2021-11-04 ENCOUNTER — Encounter: Payer: Self-pay | Admitting: Family Medicine

## 2021-11-04 NOTE — Telephone Encounter (Signed)
Yes she can donate blood again  ?

## 2021-11-29 ENCOUNTER — Other Ambulatory Visit: Payer: Self-pay | Admitting: Family Medicine

## 2021-12-28 ENCOUNTER — Other Ambulatory Visit: Payer: Self-pay | Admitting: Family Medicine

## 2021-12-29 NOTE — Telephone Encounter (Signed)
Pt LOV was on 10/25/2021 Last refill done on 06/30/2020 Please advise

## 2022-02-01 ENCOUNTER — Ambulatory Visit (AMBULATORY_SURGERY_CENTER): Payer: Self-pay | Admitting: *Deleted

## 2022-02-01 VITALS — Ht 65.0 in | Wt 222.8 lb

## 2022-02-01 DIAGNOSIS — Z1211 Encounter for screening for malignant neoplasm of colon: Secondary | ICD-10-CM

## 2022-02-01 MED ORDER — NA SULFATE-K SULFATE-MG SULF 17.5-3.13-1.6 GM/177ML PO SOLN: 1.0000 | Freq: Once | ORAL | 0 refills | Status: AC

## 2022-02-01 NOTE — Progress Notes (Signed)
No egg or soy allergy known to patient  No issues known to pt with past sedation with any surgeries or procedures Patient denies ever being told they had issues or difficulty with intubation  No FH of Malignant Hyperthermia Pt is not on diet pills Pt is not on  home 02  Pt is not on blood thinners  Pt denies issues with constipation  No A fib or A flutter Have any cardiac testing pending--no Pt instructed to use [email or web address] or GoodRx for a price reduction on prep   

## 2022-02-02 ENCOUNTER — Encounter: Payer: Self-pay | Admitting: Family Medicine

## 2022-02-02 ENCOUNTER — Ambulatory Visit: Payer: No Typology Code available for payment source | Admitting: Family Medicine

## 2022-02-02 VITALS — BP 130/90 | HR 72 | Temp 98.9°F | Wt 221.0 lb

## 2022-02-02 DIAGNOSIS — G4730 Sleep apnea, unspecified: Secondary | ICD-10-CM | POA: Diagnosis not present

## 2022-02-02 DIAGNOSIS — I10 Essential (primary) hypertension: Secondary | ICD-10-CM

## 2022-02-02 DIAGNOSIS — M25473 Effusion, unspecified ankle: Secondary | ICD-10-CM | POA: Diagnosis not present

## 2022-02-02 MED ORDER — AMLODIPINE BESYLATE 10 MG PO TABS: 5.0000 mg | ORAL_TABLET | Freq: Every day | ORAL | 3 refills | Status: DC

## 2022-02-02 MED ORDER — LOSARTAN POTASSIUM-HCTZ 50-12.5 MG PO TABS: 1.0000 | ORAL_TABLET | Freq: Every day | ORAL | 2 refills | Status: DC

## 2022-02-02 NOTE — Progress Notes (Signed)
   Subjective:    Patient ID: Beverly Rogers, female    DOB: 01/12/1971, 51 y.o.   MRN: [id number]  HPI Here for several issues. First she has had 5 days of swelling in both feet and ankles. She has never had this before. No chest pain or SOB. Her BP has been stable. Also she wonders if she could have sleep apnea. She says she wakes up frequently at night and her son tells her she snores a lot. She also feels tired and sleepy during the day as well.    Review of Systems  Constitutional:  Positive for fatigue.  Respiratory: Negative.    Cardiovascular:  Positive for leg swelling. Negative for chest pain and palpitations.  Neurological: Negative.        Objective:   Physical Exam Constitutional:      Appearance: She is obese. She is not ill-appearing.  Cardiovascular:     Rate and Rhythm: Normal rate and regular rhythm.     Pulses: Normal pulses.     Heart sounds: Normal heart sounds.  Pulmonary:     Effort: Pulmonary effort is normal.     Breath sounds: Normal breath sounds.  Musculoskeletal:     Comments: 2+ edema in both ankles   Neurological:     Mental Status: She is alert.           Assessment & Plan:  The edema is due to a combination of Amlodipine side effects and the summer heat. We will decrease the Amlodipine to 1/2 a tablet (5 mg) daily, and we will change from HCTZ 25 mg to Losartan HCT 50-12.5 taken daily. She will follow up here in 4 weeks. She also very likely has sleep apnea, so we will refer her to Pulmonology for evaluation.  Alysia Penna, MD

## 2022-02-13 ENCOUNTER — Other Ambulatory Visit: Payer: Self-pay | Admitting: Family Medicine

## 2022-02-15 ENCOUNTER — Encounter: Payer: Self-pay | Admitting: Nurse Practitioner

## 2022-02-15 ENCOUNTER — Ambulatory Visit (INDEPENDENT_AMBULATORY_CARE_PROVIDER_SITE_OTHER): Payer: No Typology Code available for payment source

## 2022-02-15 ENCOUNTER — Ambulatory Visit (INDEPENDENT_AMBULATORY_CARE_PROVIDER_SITE_OTHER): Payer: No Typology Code available for payment source | Admitting: Nurse Practitioner

## 2022-02-15 VITALS — BP 120/80 | HR 82 | Temp 98.5°F | Ht 65.0 in | Wt 218.0 lb

## 2022-02-15 DIAGNOSIS — G4719 Other hypersomnia: Secondary | ICD-10-CM

## 2022-02-15 DIAGNOSIS — R0609 Other forms of dyspnea: Secondary | ICD-10-CM | POA: Diagnosis not present

## 2022-02-15 DIAGNOSIS — R0600 Dyspnea, unspecified: Secondary | ICD-10-CM | POA: Insufficient documentation

## 2022-02-15 NOTE — Patient Instructions (Addendum)
Home sleep study - someone will contact you for scheduling  Pulmonary function testing scheduled today   Labs today - CBC with diff, BMET, BNP, TSH  We discussed how untreated sleep apnea puts an individual at risk for cardiac arrhthymias, pulm HTN, DM, stroke and increases their risk for daytime accidents. We also briefly reviewed treatment options including weight loss, side sleeping position, oral appliance, CPAP therapy or referral to ENT for possible surgical options  Follow up after home sleep study and PFTs with Dr. Halford Chessman in new patient 30 min slot. If symptoms do not improve or worsen, please contact office for sooner follow up or seek emergency care.

## 2022-02-15 NOTE — Assessment & Plan Note (Addendum)
Occurs only upon exertion. New onset over the past few months. No significant pulmonary or cardiac history. Possibly related to deconditioning, obesity, and ?untreated underlying OSA; however, concerned for anemia, thyroid disease, or cardiac etiology given constellation of symptoms. There could be an underlying asthmatic component as well given the timing of symptoms and occasional cough. She is a never smoker. We will obtain PFTs for further evaluation. Labs today - TSH, CBC with diff, BNP, BMET.

## 2022-02-15 NOTE — Assessment & Plan Note (Signed)
She has snoring, excessive daytime sleepiness, and morning headaches. Given this and her obesity,  I am concerned she could have sleep disordered breathing with obstructive sleep apnea. We discussed that untreated underlying OSA could be the contributing cause of her exertional dyspnea as well. She will need home sleep study for further evaluation.   - discussed how weight can impact sleep and risk for sleep disordered breathing - discussed options to assist with weight loss: combination of diet modification, cardiovascular and strength training exercises  - had an extensive discussion regarding the adverse health consequences related to untreated sleep disordered breathing - specifically discussed the risks for hypertension, coronary artery disease, cardiac dysrhythmias, cerebrovascular disease, and diabetes - lifestyle modification discussed  - discussed how sleep disruption can increase risk of accidents, particularly when driving - safe driving practices were discussed  Patient Instructions  Home sleep study - someone will contact you for scheduling  Pulmonary function testing scheduled today   Labs today - CBC with diff, BMET, BNP, TSH  We discussed how untreated sleep apnea puts an individual at risk for cardiac arrhthymias, pulm HTN, DM, stroke and increases their risk for daytime accidents. We also briefly reviewed treatment options including weight loss, side sleeping position, oral appliance, CPAP therapy or referral to ENT for possible surgical options  Follow up after home sleep study and PFTs with Dr. Halford Chessman in new patient 30 min slot. If symptoms do not improve or worsen, please contact office for sooner follow up or seek emergency care.

## 2022-02-15 NOTE — Progress Notes (Signed)
$'@Patient'D$  ID: Beverly Rogers, female    DOB: December 24, 1970, 51 y.o.   MRN: [id number]  Chief Complaint  Patient presents with   Consult    She feels tired all the time,. She feels the pregnancy tired, She can fall asleep quickly.     Referring provider: Laurey Morale, MD  HPI: 51 year old female, never smoker referred for sleep consult by Dr. Sarajane Jews. Past medical history significant for migraines, HTN, allergic rhinitis, GERD, depression with anxiety, hx of IDA.   TEST/EVENTS:   02/15/2022: Today - sleep consult Patient presents today for a sleep consult.  She was kindly referred by Dr. Sarajane Jews for concern for OSA.  She has had excessive daytime fatigue symptoms, worse over the last few months.  Wakes up in the morning feeling poorly rested, despite getting 10 to 12 hours of sleep.  Feels like she could fall asleep at any time during the day.  She does take a nap on her lunch break.  Occasionally wakes up in the morning with a headache.  She has also been told that she snores.  She does have a history of sleepwalking and talking as a child.  Has not experienced any of this as an adult that she is aware of.  Struggles with drowsy driving; will pull over if this happens.  Has not had any accidents related to this and has not fallen asleep while driving.  She denies any cataplectic symptoms or sleep paralysis.  She goes to bed around 6 to 7 PM; usually takes 30 minutes to an hour to fall asleep.  She does take Ambien to help her get to sleep at night.  Only wakes once or twice to use the restroom.  Wakes in the morning around 645 to 7:30 AM; hits the snooze button frequently.  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  Has had weight gain over the past few years of around 40 pounds.  She has never had a sleep study before. No history of CHF or stroke. She does have HTN, which is currently controlled. Her father and brother both have sleep apnea and wear a CPAP.   She  has also been having trouble with her breathing over the last few months.  Feels like she gets winded easily with activity.  Is unable to do as much as she was in the past as far as exercise go.  Sometimes notices that when she goes outside, it's worse. She does not notice any wheezing but does have an occasional dry cough, which she contributes to reflux symptoms.  She is on omeprazole for this.  She is also had some increased swelling in her legs.  She addressed this with her PCP who adjusted her amlodipine and thought that it was also related to the summer heat.  Med adjustment has not made much difference and she continues to have the swelling.  She denies any orthopnea, PND, palpitations, chest pain, syncope.  She is a never smoker and does not have a history of asthma.  She has had COVID in the past which was the only time she used inhalers.  Has not used any inhaler since.  Current weight 218 lb with BMI 36.28 Epworth 21/24  Allergies  Allergen Reactions   Bactrim [Sulfamethoxazole-Trimethoprim] Hives   Ciprofloxacin Rash   Lisinopril Cough    Immunization History  Administered Date(s) Administered   Influenza,inj,Quad PF,6+ Mos 04/08/2013, 07/12/2018, 05/13/2019, 07/06/2020, 05/02/2021  Influenza-Unspecified 05/11/2015   PFIZER(Purple Top)SARS-COV-2 Vaccination 11/29/2019, 12/22/2019, 06/25/2020   Tdap 04/08/2013    Past Medical History:  Diagnosis Date   Allergy    Anemia    "many years ago"   Anxiety    Arthritis    back,neck   Depression    post partum   GERD (gastroesophageal reflux disease)    Hx of appendectomy    Hyperlipidemia    Hypertension    IBS (irritable bowel syndrome)    Migraine    Vitamin D deficiency     Tobacco History: Social History   Tobacco Use  Smoking Status Never   Passive exposure: Never  Smokeless Tobacco Never   Counseling given: Not Answered   Outpatient Medications Prior to Visit  Medication Sig Dispense Refill   albuterol  (VENTOLIN HFA) 108 (90 Base) MCG/ACT inhaler INHALE 2 PUFFS INTO THE LUNGS EVERY 4 HOURS AS NEEDED FOR WHEEZING OR SHORTNESS OF BREATH. 8.5 each 1   amLODipine (NORVASC) 10 MG tablet Take 0.5 tablets (5 mg total) by mouth daily. 90 tablet 3   cyclobenzaprine (FLEXERIL) 10 MG tablet TAKE 1 TABLET BY MOUTH THREE TIMES A DAY AS NEEDED FOR MUSCLE SPASMS 90 tablet 5   estradiol (CLIMARA - DOSED IN MG/24 HR) 0.025 mg/24hr patch Place 1 patch (0.025 mg total) onto the skin 2 (two) times a week. 4 patch 12   famotidine (PEPCID) 40 MG tablet Take 1 tablet (40 mg total) by mouth at bedtime. 90 tablet 3   FLUoxetine (PROZAC) 40 MG capsule Take 1 capsule (40 mg total) by mouth 2 (two) times daily. 180 capsule 3   halobetasol (ULTRAVATE) 0.05 % cream Apply topically 2 (two) times daily as needed. For eczema 50 g 5   Iron, Ferrous Sulfate, 325 (65 Fe) MG TABS Take 325 mg by mouth daily. 90 tablet 3   ketoconazole (NIZORAL) 2 % cream Apply 1 application topically 2 (two) times daily as needed for irritation (yeast infection). 30 g 5   linaclotide (LINZESS) 290 MCG CAPS capsule Take 1 capsule (290 mcg total) by mouth daily before breakfast. Please schedule a yearly follow up visit for further refills. Thank you. 30 capsule 1   LORazepam (ATIVAN) 0.5 MG tablet TAKE 1 TABLET BY MOUTH EVERY 8 HOURS AS NEEDED FOR ANXIETY 90 tablet 5   losartan-hydrochlorothiazide (HYZAAR) 50-12.5 MG tablet Take 1 tablet by mouth daily. 30 tablet 2   metoprolol succinate (TOPROL-XL) 100 MG 24 hr tablet TAKE 1 TABLET BY MOUTH DAILY. TAKE WITH OR IMMEDIATELY FOLLOWING A MEAL. 90 tablet 1   omeprazole (PRILOSEC) 40 MG capsule TAKE 1 CAPSULE BY MOUTH EVERY DAY (Patient taking differently: Take 40 mg by mouth as needed.) 90 capsule 0   promethazine (PHENERGAN) 25 MG tablet TAKE 1 TABLET (25 MG TOTAL) BY MOUTH EVERY 4 (FOUR) HOURS AS NEEDED FOR UP TO 7 DAYS FOR NAUSEA. 60 tablet 5   rizatriptan (MAXALT) 10 MG tablet May repeat in 2 hours if  needed 10 tablet 11   solifenacin (VESICARE) 5 MG tablet Take 1 tablet (5 mg total) by mouth daily. 30 tablet 5   valACYclovir (VALTREX) 500 MG tablet Take 1 tablet by mouth as needed.     zolpidem (AMBIEN CR) 6.25 MG CR tablet Take 6.25 mg by mouth at bedtime as needed for sleep.     No facility-administered medications prior to visit.     Review of Systems:   Constitutional: No night sweats, fevers, chills, or lassitude. +excessive  daytime fatigue, weight gain HEENT: No difficulty swallowing, tooth/dental problems, or sore throat. No sneezing, itching, ear ache, nasal congestion, or post nasal drip. +AM headaches CV:  +swelling in lower extremities. No chest pain, orthopnea, PND, anasarca, dizziness, palpitations, syncope Resp: +shortness of breath with exertion; occasional dry cough. No excess mucus or change in color of mucus. No hemoptysis. No wheezing.  No chest wall deformity GI:  +occasional heartburn/indigestion. No abdominal pain, nausea, vomiting, diarrhea, change in bowel habits, loss of appetite, bloody stools.  GU: No dysuria, change in color of urine, urgency or frequency.  No flank pain, no hematuria  Skin: No rash, lesions, ulcerations MSK:  No joint pain or swelling.  No decreased range of motion.  No back pain. Neuro: No dizziness or lightheadedness.  Psych: No depression or anxiety. Mood stable.     Physical Exam:  BP 120/80 (BP Location: Left Arm, Cuff Size: Normal)   Pulse 82   Temp 98.5 F (36.9 C) (Oral)   Ht '5\' 5"'$  (1.651 m)   Wt 218 lb (98.9 kg)   LMP 09/29/2010   SpO2 97%   BMI 36.28 kg/m   GEN: Pleasant, interactive, well-appearing; obese; in no acute distress. HEENT:  Normocephalic and atraumatic. PERRLA. Sclera white. Nasal turbinates pink, moist and patent bilaterally. No rhinorrhea present. Oropharynx pink and moist, without exudate or edema. No lesions, ulcerations, or postnasal drip.  NECK:  Supple w/ fair ROM. No JVD present. Normal carotid  impulses w/o bruits. Thyroid symmetrical with no goiter or nodules palpated. No lymphadenopathy.   CV: RRR, no m/r/g, dependent, non-pitting BLE edema. Pulses intact, +2 bilaterally. No cyanosis, pallor or clubbing. PULMONARY:  Unlabored, regular breathing. Clear bilaterally A&P w/o wheezes/rales/rhonchi. No accessory muscle use. No dullness to percussion. GI: BS present and normoactive. Soft, non-tender to palpation. No organomegaly or masses detected. No CVA tenderness. MSK: No erythema, warmth or tenderness. Cap refil <2 sec all extrem. No deformities or joint swelling noted.  Neuro: A/Ox3. No focal deficits noted.   Skin: Warm, no lesions or rashe Psych: Normal affect and behavior. Judgement and thought content appropriate.     Lab Results:  CBC    Component Value Date/Time   WBC 4.9 10/25/2021 1140   RBC 4.56 10/25/2021 1140   HGB 12.3 10/25/2021 1140   HCT 37.9 10/25/2021 1140   PLT 271.0 10/25/2021 1140   MCV 83.1 10/25/2021 1140   MCHC 32.4 10/25/2021 1140   RDW 14.4 10/25/2021 1140   LYMPHSABS 2.1 10/25/2021 1140   MONOABS 0.5 10/25/2021 1140   EOSABS 0.2 10/25/2021 1140   BASOSABS 0.1 10/25/2021 1140    BMET    Component Value Date/Time   NA 142 09/08/2020 0957   K 4.8 09/08/2020 0957   CL 107 09/08/2020 0957   CO2 30 09/08/2020 0957   GLUCOSE 96 09/08/2020 0957   BUN 16 09/08/2020 0957   CREATININE 0.72 09/08/2020 0957   CALCIUM 9.4 09/08/2020 0957   GFRNONAA 111.05 05/06/2010 0853    BNP No results found for: "BNP"   Imaging:  No results found.        No data to display          No results found for: "NITRICOXIDE"      Assessment & Plan:   Excessive daytime sleepiness She has snoring, excessive daytime sleepiness, and morning headaches. Given this and her obesity,  I am concerned she could have sleep disordered breathing with obstructive sleep apnea. We discussed that untreated underlying OSA  could be the contributing cause of her  exertional dyspnea as well. She will need home sleep study for further evaluation.   - discussed how weight can impact sleep and risk for sleep disordered breathing - discussed options to assist with weight loss: combination of diet modification, cardiovascular and strength training exercises   - had an extensive discussion regarding the adverse health consequences related to untreated sleep disordered breathing - specifically discussed the risks for hypertension, coronary artery disease, cardiac dysrhythmias, cerebrovascular disease, and diabetes - lifestyle modification discussed   - discussed how sleep disruption can increase risk of accidents, particularly when driving - safe driving practices were discussed  Patient Instructions  Home sleep study - someone will contact you for scheduling  Pulmonary function testing scheduled today   Labs today - CBC with diff, BMET, BNP, TSH  We discussed how untreated sleep apnea puts an individual at risk for cardiac arrhthymias, pulm HTN, DM, stroke and increases their risk for daytime accidents. We also briefly reviewed treatment options including weight loss, side sleeping position, oral appliance, CPAP therapy or referral to ENT for possible surgical options  Follow up after home sleep study and PFTs with Dr. Halford Chessman in new patient 30 min slot. If symptoms do not improve or worsen, please contact office for sooner follow up or seek emergency care.     Dyspnea Occurs only upon exertion. New onset over the past few months. No significant pulmonary or cardiac history. Possibly related to deconditioning, obesity, and ?untreated underlying OSA; however, concerned for anemia, thyroid disease, or cardiac etiology given constellation of symptoms. There could be an underlying asthmatic component as well given the timing of symptoms and occasional cough. She is a never smoker. We will obtain PFTs for further evaluation. Labs today - TSH, CBC with diff, BNP,  BMET.    I spent 45 minutes of dedicated to the care of this patient on the date of this encounter to include pre-visit review of records, face-to-face time with the patient discussing conditions above, post visit ordering of testing, clinical documentation with the electronic health record, making appropriate referrals as documented, and communicating necessary findings to members of the patients care team.  Clayton Bibles, NP 02/15/2022  Pt aware and understands NP's role.

## 2022-02-16 LAB — CBC WITH DIFFERENTIAL/PLATELET
Basophils Absolute: 0.1 10*3/uL (ref 0.0–0.1)
Basophils Relative: 1.1 % (ref 0.0–3.0)
Eosinophils Absolute: 0.2 10*3/uL (ref 0.0–0.7)
Eosinophils Relative: 3.2 % (ref 0.0–5.0)
HCT: 39.1 % (ref 36.0–46.0)
Hemoglobin: 12.2 g/dL (ref 12.0–15.0)
Lymphocytes Relative: 41.7 % (ref 12.0–46.0)
Lymphs Abs: 2.5 10*3/uL (ref 0.7–4.0)
MCHC: 31.2 g/dL (ref 30.0–36.0)
MCV: 82.7 fl (ref 78.0–100.0)
Monocytes Absolute: 0.7 10*3/uL (ref 0.1–1.0)
Monocytes Relative: 12.4 % — ABNORMAL HIGH (ref 3.0–12.0)
Neutro Abs: 2.5 10*3/uL (ref 1.4–7.7)
Neutrophils Relative %: 41.6 % — ABNORMAL LOW (ref 43.0–77.0)
Platelets: 322 10*3/uL (ref 150.0–400.0)
RBC: 4.72 Mil/uL (ref 3.87–5.11)
RDW: 14.3 % (ref 11.5–15.5)
WBC: 5.9 10*3/uL (ref 4.0–10.5)

## 2022-02-16 LAB — TSH: TSH: 1.82 u[IU]/mL (ref 0.35–5.50)

## 2022-02-16 LAB — BASIC METABOLIC PANEL
BUN: 8 mg/dL (ref 6–23)
CO2: 28 mEq/L (ref 19–32)
Calcium: 9.3 mg/dL (ref 8.4–10.5)
Chloride: 104 mEq/L (ref 96–112)
Creatinine, Ser: 0.86 mg/dL (ref 0.40–1.20)
GFR: 77.3 mL/min (ref >60.00)
Glucose, Bld: 84 mg/dL (ref 70–99)
Potassium: 3.8 mEq/L (ref 3.5–5.1)
Sodium: 138 mEq/L (ref 135–145)

## 2022-02-16 LAB — BRAIN NATRIURETIC PEPTIDE: Pro B Natriuretic peptide (BNP): 13 pg/mL (ref 0.0–100.0)

## 2022-02-16 NOTE — Progress Notes (Signed)
Please notify patient chest x ray was clear. Thanks.

## 2022-02-16 NOTE — Progress Notes (Signed)
Reviewed and agree with assessment/plan.   Chesley Mires, MD [facility] 02/16/2022, 8:20 AM Pager:  [phone]

## 2022-02-16 NOTE — Progress Notes (Signed)
Please notify patient labs were all unremarkable! Nothing to correlate to her fatigue or shortness of breath. We will see what her pulmonary function testing and home sleep study show. Thanks!

## 2022-02-28 ENCOUNTER — Encounter: Payer: Self-pay | Admitting: Gastroenterology

## 2022-03-08 ENCOUNTER — Encounter: Payer: Self-pay | Admitting: Gastroenterology

## 2022-03-08 ENCOUNTER — Ambulatory Visit (AMBULATORY_SURGERY_CENTER): Payer: No Typology Code available for payment source | Admitting: Gastroenterology

## 2022-03-08 VITALS — BP 171/95 | HR 58 | Temp 97.7°F | Resp 16 | Ht 65.0 in | Wt 222.8 lb

## 2022-03-08 DIAGNOSIS — D123 Benign neoplasm of transverse colon: Secondary | ICD-10-CM | POA: Diagnosis not present

## 2022-03-08 DIAGNOSIS — Z1211 Encounter for screening for malignant neoplasm of colon: Secondary | ICD-10-CM | POA: Diagnosis present

## 2022-03-08 HISTORY — PX: COLONOSCOPY: SHX174

## 2022-03-08 MED ORDER — SODIUM CHLORIDE 0.9 % IV SOLN: 500.0000 mL | Freq: Once | INTRAVENOUS | Status: DC

## 2022-03-08 NOTE — Progress Notes (Signed)
Report to PACU, RN, vss, BBS= Clear.  

## 2022-03-08 NOTE — Progress Notes (Signed)
VS completed by DT.  Pt's states no medical or surgical changes since previsit or office visit.  

## 2022-03-08 NOTE — Progress Notes (Signed)
[facility] History and Physical   Primary Care Physician:  Laurey Morale, MD   Reason for Procedure:   Colon cancer screening  Plan:    colonoscopy     HPI: Beverly Rogers is a 51 y.o. female  here for colonoscopy screening - last exam 2012. Patient denies any bowel symptoms at this time that are new - some chronic constipation. No family history of colon cancer known. Otherwise feels well without any cardiopulmonary symptoms.   I have discussed risks / benefits of anesthesia and endoscopic procedure with Beverly Rogers and they wish to proceed with the exams as outlined today.    Past Medical History:  Diagnosis Date   Allergy    Anemia    "many years ago"   Anxiety    Arthritis    back,neck   Depression    post partum   GERD (gastroesophageal reflux disease)    Hx of appendectomy    Hyperlipidemia    Hypertension    IBS (irritable bowel syndrome)    Migraine    Vitamin D deficiency     Past Surgical History:  Procedure Laterality Date   APPENDECTOMY     COLONOSCOPY     FRACTURE SURGERY     repair in the rt lower leg screwsand plate in place   PARTIAL HYSTERECTOMY  11/16/2010    Prior to Admission medications   Medication Sig Start Date End Date Taking? Authorizing Provider  amLODipine (NORVASC) 10 MG tablet Take 0.5 tablets (5 mg total) by mouth daily. 02/02/22  Yes Laurey Morale, MD  estradiol (CLIMARA - DOSED IN MG/24 HR) 0.025 mg/24hr patch Place 1 patch (0.025 mg total) onto the skin 2 (two) times a week. 11/05/17  Yes Laurey Morale, MD  FLUoxetine (PROZAC) 40 MG capsule Take 1 capsule (40 mg total) by mouth 2 (two) times daily. 10/25/21  Yes Laurey Morale, MD  Iron, Ferrous Sulfate, 325 (65 Fe) MG TABS Take 325 mg by mouth daily. 03/02/21  Yes Laurey Morale, MD  metoprolol succinate (TOPROL-XL) 100 MG 24 hr tablet TAKE 1 TABLET BY MOUTH DAILY. TAKE WITH OR IMMEDIATELY FOLLOWING A MEAL. 11/29/21  Yes Laurey Morale, MD  zolpidem (AMBIEN  CR) 6.25 MG CR tablet Take 6.25 mg by mouth at bedtime as needed for sleep.   Yes [provider]  albuterol (VENTOLIN HFA) 108 (90 Base) MCG/ACT inhaler INHALE 2 PUFFS INTO THE LUNGS EVERY 4 HOURS AS NEEDED FOR WHEEZING OR SHORTNESS OF BREATH. 02/13/22   Laurey Morale, MD  cyclobenzaprine (FLEXERIL) 10 MG tablet TAKE 1 TABLET BY MOUTH THREE TIMES A DAY AS NEEDED FOR MUSCLE SPASMS 06/30/20   Laurey Morale, MD  famotidine (PEPCID) 40 MG tablet Take 1 tablet (40 mg total) by mouth at bedtime. 09/08/20   Laurey Morale, MD  halobetasol (ULTRAVATE) 0.05 % cream Apply topically 2 (two) times daily as needed. For eczema 11/06/16   Laurey Morale, MD  ketoconazole (NIZORAL) 2 % cream Apply 1 application topically 2 (two) times daily as needed for irritation (yeast infection). 11/06/16   Laurey Morale, MD  linaclotide Rolan Lipa) 290 MCG CAPS capsule Take 1 capsule (290 mcg total) by mouth daily before breakfast. Please schedule a yearly follow up visit for further refills. Thank you. 04/19/20   Endia Moncur, Carlota Raspberry, MD  LORazepam (ATIVAN) 0.5 MG tablet TAKE 1 TABLET BY MOUTH EVERY 8 HOURS AS NEEDED FOR ANXIETY 12/30/21   Alysia Penna  A, MD  losartan-hydrochlorothiazide (HYZAAR) 50-12.5 MG tablet Take 1 tablet by mouth daily. 02/02/22   Laurey Morale, MD  omeprazole (PRILOSEC) 40 MG capsule TAKE 1 CAPSULE BY MOUTH EVERY DAY Patient taking differently: Take 40 mg by mouth as needed. 08/16/20   Yetta Flock, MD  promethazine (PHENERGAN) 25 MG tablet TAKE 1 TABLET (25 MG TOTAL) BY MOUTH EVERY 4 (FOUR) HOURS AS NEEDED FOR UP TO 7 DAYS FOR NAUSEA. 06/30/20 02/15/22  Laurey Morale, MD  rizatriptan (MAXALT) 10 MG tablet May repeat in 2 hours if needed 08/09/21   Laurey Morale, MD  solifenacin (VESICARE) 5 MG tablet Take 1 tablet (5 mg total) by mouth daily. 10/25/21   Laurey Morale, MD  valACYclovir (VALTREX) 500 MG tablet Take 1 tablet by mouth as needed. 01/21/20   [provider]    Current  Outpatient Medications  Medication Sig Dispense Refill   amLODipine (NORVASC) 10 MG tablet Take 0.5 tablets (5 mg total) by mouth daily. 90 tablet 3   estradiol (CLIMARA - DOSED IN MG/24 HR) 0.025 mg/24hr patch Place 1 patch (0.025 mg total) onto the skin 2 (two) times a week. 4 patch 12   FLUoxetine (PROZAC) 40 MG capsule Take 1 capsule (40 mg total) by mouth 2 (two) times daily. 180 capsule 3   Iron, Ferrous Sulfate, 325 (65 Fe) MG TABS Take 325 mg by mouth daily. 90 tablet 3   metoprolol succinate (TOPROL-XL) 100 MG 24 hr tablet TAKE 1 TABLET BY MOUTH DAILY. TAKE WITH OR IMMEDIATELY FOLLOWING A MEAL. 90 tablet 1   zolpidem (AMBIEN CR) 6.25 MG CR tablet Take 6.25 mg by mouth at bedtime as needed for sleep.     albuterol (VENTOLIN HFA) 108 (90 Base) MCG/ACT inhaler INHALE 2 PUFFS INTO THE LUNGS EVERY 4 HOURS AS NEEDED FOR WHEEZING OR SHORTNESS OF BREATH. 8.5 each 1   cyclobenzaprine (FLEXERIL) 10 MG tablet TAKE 1 TABLET BY MOUTH THREE TIMES A DAY AS NEEDED FOR MUSCLE SPASMS 90 tablet 5   famotidine (PEPCID) 40 MG tablet Take 1 tablet (40 mg total) by mouth at bedtime. 90 tablet 3   halobetasol (ULTRAVATE) 0.05 % cream Apply topically 2 (two) times daily as needed. For eczema 50 g 5   ketoconazole (NIZORAL) 2 % cream Apply 1 application topically 2 (two) times daily as needed for irritation (yeast infection). 30 g 5   linaclotide (LINZESS) 290 MCG CAPS capsule Take 1 capsule (290 mcg total) by mouth daily before breakfast. Please schedule a yearly follow up visit for further refills. Thank you. 30 capsule 1   LORazepam (ATIVAN) 0.5 MG tablet TAKE 1 TABLET BY MOUTH EVERY 8 HOURS AS NEEDED FOR ANXIETY 90 tablet 5   losartan-hydrochlorothiazide (HYZAAR) 50-12.5 MG tablet Take 1 tablet by mouth daily. 30 tablet 2   omeprazole (PRILOSEC) 40 MG capsule TAKE 1 CAPSULE BY MOUTH EVERY DAY (Patient taking differently: Take 40 mg by mouth as needed.) 90 capsule 0   promethazine (PHENERGAN) 25 MG tablet TAKE 1  TABLET (25 MG TOTAL) BY MOUTH EVERY 4 (FOUR) HOURS AS NEEDED FOR UP TO 7 DAYS FOR NAUSEA. 60 tablet 5   rizatriptan (MAXALT) 10 MG tablet May repeat in 2 hours if needed 10 tablet 11   solifenacin (VESICARE) 5 MG tablet Take 1 tablet (5 mg total) by mouth daily. 30 tablet 5   valACYclovir (VALTREX) 500 MG tablet Take 1 tablet by mouth as needed.     Current Facility-Administered  Medications  Medication Dose Route Frequency Provider Last Rate Last Admin   0.9 %  sodium chloride infusion  500 mL Intravenous Once Kashara Blocher, Carlota Raspberry, MD        Allergies as of 03/08/2022 - Review Complete 03/08/2022  Allergen Reaction Noted   Bactrim [sulfamethoxazole-trimethoprim] Hives 01/22/2012   Ciprofloxacin Rash 02/05/2007   Lisinopril Cough 09/22/2020    Family History  Problem Relation Age of Onset   Hyperlipidemia Mother    Hypertension Mother    Hyperlipidemia Father    Hypertension Father    Colon cancer Neg Hx    Esophageal cancer Neg Hx    Rectal cancer Neg Hx    Stomach cancer Neg Hx    Colon polyps Neg Hx    Crohn's disease Neg Hx    Alpha-1 antitrypsin deficiency Neg Hx     Social History   Socioeconomic History   Marital status: Divorced    Spouse name: Not on file   Number of children: Not on file   Years of education: Not on file   Highest education level: Not on file  Occupational History   Occupation: travel Surveyor, mining: [employer]  Tobacco Use   Smoking status: Never    Passive exposure: Never   Smokeless tobacco: Never  Vaping Use   Vaping Use: Never used  Substance and Sexual Activity   Alcohol use: No    Alcohol/week: 0.0 standard drinks of alcohol   Drug use: No   Sexual activity: Not on file  Other Topics Concern   Not on file  Social History Narrative   Not on file   Social Determinants of Health   Financial Resource Strain: Not on file  Food Insecurity: Not on file  Transportation Needs: Not on file  Physical Activity: Not on  file  Stress: Not on file  Social Connections: Not on file  Intimate Partner Violence: Not on file    Review of Systems: All other review of systems negative except as mentioned in the HPI.  Physical Exam: Vital signs BP 121/76   Pulse 68   Temp 97.7 F (36.5 C) (Temporal)   Ht '5\' 5"'$  (1.651 m)   Wt 222 lb 12.8 oz (101.1 kg)   LMP 09/29/2010   SpO2 97%   BMI 37.08 kg/m   General:   Alert,  Well-developed, pleasant and cooperative in NAD Lungs:  Clear throughout to auscultation.   Heart:  Regular rate and rhythm Abdomen:  Soft, nontender and nondistended.   Neuro/Psych:  Alert and cooperative. Normal mood and affect. A and O x 3  Jolly Mango, MD [facility]

## 2022-03-08 NOTE — Op Note (Signed)
[facility] Patient Name: Beverly Rogers Procedure Date: 03/08/2022 8:06 AM MRN: [id number] Endoscopist: Remo Lipps P. Havery Moros , MD Age: 51 Referring MD:  Date of Birth: [birth year] Gender: Female Account #: [id number] Procedure:                Colonoscopy Indications:              Screening for colorectal malignant neoplasm Medicines:                Monitored Anesthesia Care Procedure:                Pre-Anesthesia Assessment:                           - Prior to the procedure, a History and Physical                            was performed, and patient medications and                            allergies were reviewed. The patient's tolerance of                            previous anesthesia was also reviewed. The risks                            and benefits of the procedure and the sedation                            options and risks were discussed with the patient.                            All questions were answered, and informed consent                            was obtained. Prior Anticoagulants: The patient has                            taken no previous anticoagulant or antiplatelet                            agents. ASA Grade Assessment: II - A patient with                            mild systemic disease. After reviewing the risks                            and benefits, the patient was deemed in                            satisfactory condition to undergo the procedure.                           After obtaining informed consent, the colonoscope  was passed under direct vision. Throughout the                            procedure, the patient's blood pressure, pulse, and                            oxygen saturations were monitored continuously. The                            Olympus CF-HQ190L ([id number]) Colonoscope was                            introduced through the anus and advanced to the the                            cecum,  identified by appendiceal orifice and                            ileocecal valve. The colonoscopy was performed                            without difficulty. The patient tolerated the                            procedure well. The quality of the bowel                            preparation was good. The ileocecal valve,                            appendiceal orifice, and rectum were photographed. Scope In: 8:14:56 AM Scope Out: 8:33:25 AM Scope Withdrawal Time: 0 hours 14 minutes 0 seconds  Total Procedure Duration: 0 hours 18 minutes 29 seconds  Findings:                 The perianal and digital rectal examinations were                            normal.                           A 4 to 5 mm polyp was found in the transverse                            colon. The polyp was sessile. The polyp was removed                            with a cold snare. Resection and retrieval were                            complete.                           A few small-mouthed diverticula were found in the  ascending colon.                           The exam was otherwise without abnormality. Complications:            No immediate complications. Estimated blood loss:                            Minimal. Estimated Blood Loss:     Estimated blood loss was minimal. Impression:               - One 4 to 5 mm polyp in the transverse colon,                            removed with a cold snare. Resected and retrieved.                           - Diverticulosis in the ascending colon.                           - The examination was otherwise normal. Recommendation:           - Patient has a contact number available for                            emergencies. The signs and symptoms of potential                            delayed complications were discussed with the                            patient. Return to normal activities tomorrow.                            Written discharge  instructions were provided to the                            patient.                           - Resume previous diet.                           - Continue present medications.                           - Await pathology results. Remo Lipps P. Havery Moros, MD 03/08/2022 8:36:52 AM This report has been signed electronically.

## 2022-03-08 NOTE — Progress Notes (Signed)
Called to room to assist during endoscopic procedure.  Patient ID and intended procedure confirmed with present staff. Received instructions for my participation in the procedure from the performing physician.  

## 2022-03-08 NOTE — Patient Instructions (Signed)
Please read handouts provided. Continue present medications. Await pathology results.   YOU HAD AN ENDOSCOPIC PROCEDURE TODAY AT THE [facility]:   Refer to the procedure report that was given to you for any specific questions about what was found during the examination.  If the procedure report does not answer your questions, please call your gastroenterologist to clarify.  If you requested that your care partner not be given the details of your procedure findings, then the procedure report has been included in a sealed envelope for you to review at your convenience later.  YOU SHOULD EXPECT: Some feelings of bloating in the abdomen. Passage of more gas than usual.  Walking can help get rid of the air that was put into your GI tract during the procedure and reduce the bloating. If you had a lower endoscopy (such as a colonoscopy or flexible sigmoidoscopy) you may notice spotting of blood in your stool or on the toilet paper. If you underwent a bowel prep for your procedure, you may not have a normal bowel movement for a few days.  Please Note:  You might notice some irritation and congestion in your nose or some drainage.  This is from the oxygen used during your procedure.  There is no need for concern and it should clear up in a day or so.  SYMPTOMS TO REPORT IMMEDIATELY:  Following lower endoscopy (colonoscopy or flexible sigmoidoscopy):  Excessive amounts of blood in the stool  Significant tenderness or worsening of abdominal pains  Swelling of the abdomen that is new, acute  Fever of 100F or higher  For urgent or emergent issues, a gastroenterologist can be reached at any hour by calling [phone]. Do not use MyChart messaging for urgent concerns.    DIET:  We do recommend a small meal at first, but then you may proceed to your regular diet.  Drink plenty of fluids but you should avoid alcoholic beverages for 24 hours.  ACTIVITY:  You should plan to take it easy for  the rest of today and you should NOT DRIVE or use heavy machinery until tomorrow (because of the sedation medicines used during the test).    FOLLOW UP: Our staff will call the number listed on your records the next business day following your procedure.  We will call around 7:15- 8:00 am to check on you and address any questions or concerns that you may have regarding the information given to you following your procedure. If we do not reach you, we will leave a message.  If you develop any symptoms (ie: fever, flu-like symptoms, shortness of breath, cough etc.) before then, please call [phone].  If you test positive for Covid 19 in the 2 weeks post procedure, please call and report this information to us.    If any biopsies were taken you will be contacted by phone or by letter within the next 1-3 weeks.  Please call us at [phone] if you have not heard about the biopsies in 3 weeks.    SIGNATURES/CONFIDENTIALITY: You and/or your care partner have signed paperwork which will be entered into your electronic medical record.  These signatures attest to the fact that that the information above on your After Visit Summary has been reviewed and is understood.  Full responsibility of the confidentiality of this discharge information lies with you and/or your care-partner.  

## 2022-03-09 ENCOUNTER — Telehealth: Payer: Self-pay | Admitting: *Deleted

## 2022-03-09 NOTE — Telephone Encounter (Signed)
  Follow up Call-     03/08/2022    7:22 AM 06/17/2019    3:40 PM  Call back number  Post procedure Call Back phone  # [phone]  Permission to leave phone message Yes Yes     Patient questions:  Mailbox is full.

## 2022-03-24 ENCOUNTER — Other Ambulatory Visit: Payer: Self-pay | Admitting: Gastroenterology

## 2022-04-05 ENCOUNTER — Ambulatory Visit (INDEPENDENT_AMBULATORY_CARE_PROVIDER_SITE_OTHER): Payer: No Typology Code available for payment source

## 2022-04-05 DIAGNOSIS — Z23 Encounter for immunization: Secondary | ICD-10-CM

## 2022-05-03 ENCOUNTER — Encounter: Payer: Self-pay | Admitting: Pulmonary Disease

## 2022-05-03 ENCOUNTER — Ambulatory Visit: Payer: No Typology Code available for payment source | Admitting: Pulmonary Disease

## 2022-05-03 ENCOUNTER — Ambulatory Visit (INDEPENDENT_AMBULATORY_CARE_PROVIDER_SITE_OTHER): Payer: No Typology Code available for payment source | Admitting: Pulmonary Disease

## 2022-05-03 VITALS — BP 136/86 | HR 88 | Temp 97.6°F | Ht 64.0 in | Wt 211.6 lb

## 2022-05-03 DIAGNOSIS — G4719 Other hypersomnia: Secondary | ICD-10-CM | POA: Diagnosis not present

## 2022-05-03 DIAGNOSIS — R0609 Other forms of dyspnea: Secondary | ICD-10-CM

## 2022-05-03 DIAGNOSIS — R0683 Snoring: Secondary | ICD-10-CM | POA: Diagnosis not present

## 2022-05-03 LAB — PULMONARY FUNCTION TEST
DL/VA % pred: 94 %
DL/VA: 4.02 ml/min/mmHg/L
DLCO cor % pred: 76 %
DLCO cor: 16.39 ml/min/mmHg
DLCO unc % pred: 76 %
DLCO unc: 16.39 ml/min/mmHg
FEF 25-75 Post: 3.28 L/sec
FEF 25-75 Pre: 2.79 L/sec
FEF2575-%Change-Post: 17 %
FEF2575-%Pred-Post: 121 %
FEF2575-%Pred-Pre: 103 %
FEV1-%Change-Post: 5 %
FEV1-%Pred-Post: 87 %
FEV1-%Pred-Pre: 82 %
FEV1-Post: 2.47 L
FEV1-Pre: 2.33 L
FEV1FVC-%Change-Post: 3 %
FEV1FVC-%Pred-Pre: 106 %
FEV6-%Change-Post: 2 %
FEV6-%Pred-Post: 80 %
FEV6-%Pred-Pre: 78 %
FEV6-Post: 2.82 L
FEV6-Pre: 2.75 L
FEV6FVC-%Pred-Post: 102 %
FEV6FVC-%Pred-Pre: 102 %
FVC-%Change-Post: 2 %
FVC-%Pred-Post: 78 %
FVC-%Pred-Pre: 76 %
FVC-Post: 2.82 L
FVC-Pre: 2.75 L
Post FEV1/FVC ratio: 88 %
Post FEV6/FVC ratio: 100 %
Pre FEV1/FVC ratio: 85 %
Pre FEV6/FVC Ratio: 100 %
RV % pred: 137 %
RV: 2.61 L
TLC % pred: 101 %
TLC: 5.26 L

## 2022-05-03 NOTE — Progress Notes (Signed)
[facility]  Chief Complaint  Patient presents with   Follow-up    Pt is here for PFT results     Past Surgical History:  She  has a past surgical history that includes Appendectomy; Fracture surgery; Partial hysterectomy (11/16/2010); and Colonoscopy.  Past Medical History:  Allergies, Anemia, Anxiety, OA, Depression, GERD, HLD, HTN, IBS, Migraine headache, Vit D deficiency  Constitutional:  BP 136/86 (BP Location: Left Arm, Patient Position: Sitting, Cuff Size: Normal)   Pulse 88   Temp 97.6 F (36.4 C) (Oral)   Ht '5\' 4"'$  (1.626 m)   Wt 211 lb 9.6 oz (96 kg)   LMP 09/29/2010   SpO2 99%   BMI 36.32 kg/m   Brief Summary:  Beverly Rogers is a 51 y.o. female with snoring and dyspnea.      Subjective:   Chest xray from 02/16/22 was normal.  Lab testing from 02/15/22 was unrevealing.  PFT was normal.  Home sleep study not done yet.  She gets winded when walking, but recovers after resting for a minute.  Not having cough, wheeze, sputum, or chest congestion.  Denies chest pain, palpitations, leg cramps, or leg swelling.    She has several family members who use CPAP.  She sores and stops breathing at night.  She is a restless sleeper.  She feels tired during the day.  She works from home on a computer and it is difficult for her to stay focused sometimes.  Physical Exam:   Appearance - well kempt   ENMT - no sinus tenderness, no oral exudate, no LAN, Mallampati 4 airway, no stridor  Respiratory - equal breath sounds bilaterally, no wheezing or rales  CV - s1s2 regular rate and rhythm, no murmurs  Ext - no clubbing, no edema  Skin - no rashes  Psych - normal mood and affect   Pulmonary testing:  PFT 05/03/22 >> FEV1 2.47 (87%), FEV1% 88, TLC 5.26 (101%), DLCO 76%  Sleep Tests:    Social History:  She  reports that she has never smoked. She has never been exposed to tobacco smoke. She has never used smokeless  tobacco. She reports that she does not drink alcohol and does not use drugs.  Family History:  Her family history includes Hyperlipidemia in her father and mother; Hypertension in her father and mother.     Assessment/Plan:   Snoring with excessive daytime sleepiness. - home sleep study has been ordered  Dyspnea on exertion. - pulmonary evaluation is unremarkable - she doesn't have symptoms that would be concerning cardiac disease at this time; discussed symptoms to monitor for - likely related to obesity and deconditioning; discussed importance of maintaining a gradual exercise program  Obesity. - discussed how weight can impact sleep and risk for sleep disordered breathing - discussed options to assist with weight loss: combination of diet modification, cardiovascular and strength training exercises  Cardiovascular risk. - had an extensive discussion regarding the adverse health consequences related to untreated sleep disordered breathing - specifically discussed the risks for hypertension, coronary artery disease, cardiac dysrhythmias, cerebrovascular disease, and diabetes - lifestyle modification discussed  Safe driving practices. - discussed how sleep disruption can increase risk of accidents, particularly when driving - safe driving practices were discussed  Therapies for obstructive sleep apnea. - if the sleep study shows significant sleep apnea, then various therapies for treatment were reviewed: CPAP, oral appliance, and surgical interventions  Time Spent Involved in Patient Care on Day  of Examination:  37 minutes  Follow up:   Patient Instructions   Will call to arrange for follow up after sleep study reviewed   Medication List:   Allergies as of 05/03/2022       Reactions   Bactrim [sulfamethoxazole-trimethoprim] Hives   Ciprofloxacin Rash   Lisinopril Cough        Medication List        Accurate as of May 03, 2022  4:40 PM. If you have any  questions, ask your nurse or doctor.          albuterol 108 (90 Base) MCG/ACT inhaler Commonly known as: VENTOLIN HFA INHALE 2 PUFFS INTO THE LUNGS EVERY 4 HOURS AS NEEDED FOR WHEEZING OR SHORTNESS OF BREATH.   amLODipine 10 MG tablet Commonly known as: NORVASC Take 0.5 tablets (5 mg total) by mouth daily.   cyclobenzaprine 10 MG tablet Commonly known as: FLEXERIL TAKE 1 TABLET BY MOUTH THREE TIMES A DAY AS NEEDED FOR MUSCLE SPASMS   estradiol 0.025 mg/24hr patch Commonly known as: CLIMARA - Dosed in mg/24 hr Place 1 patch (0.025 mg total) onto the skin 2 (two) times a week.   famotidine 40 MG tablet Commonly known as: Pepcid Take 1 tablet (40 mg total) by mouth at bedtime.   FLUoxetine 40 MG capsule Commonly known as: PROZAC Take 1 capsule (40 mg total) by mouth 2 (two) times daily.   halobetasol 0.05 % cream Commonly known as: ULTRAVATE Apply topically 2 (two) times daily as needed. For eczema   Iron (Ferrous Sulfate) 325 (65 Fe) MG Tabs Take 325 mg by mouth daily.   ketoconazole 2 % cream Commonly known as: NIZORAL Apply 1 application topically 2 (two) times daily as needed for irritation (yeast infection).   linaclotide 290 MCG Caps capsule Commonly known as: Linzess Take 1 capsule (290 mcg total) by mouth daily before breakfast. Please schedule a yearly follow up visit for further refills. Thank you.   LORazepam 0.5 MG tablet Commonly known as: ATIVAN TAKE 1 TABLET BY MOUTH EVERY 8 HOURS AS NEEDED FOR ANXIETY   losartan-hydrochlorothiazide 50-12.5 MG tablet Commonly known as: HYZAAR Take 1 tablet by mouth daily.   metoprolol succinate 100 MG 24 hr tablet Commonly known as: TOPROL-XL TAKE 1 TABLET BY MOUTH DAILY. TAKE WITH OR IMMEDIATELY FOLLOWING A MEAL.   omeprazole 40 MG capsule Commonly known as: PRILOSEC TAKE 1 CAPSULE BY MOUTH EVERY DAY   promethazine 25 MG tablet Commonly known as: PHENERGAN TAKE 1 TABLET (25 MG TOTAL) BY MOUTH EVERY 4 (FOUR)  HOURS AS NEEDED FOR UP TO 7 DAYS FOR NAUSEA.   rizatriptan 10 MG tablet Commonly known as: MAXALT May repeat in 2 hours if needed   solifenacin 5 MG tablet Commonly known as: VESICARE Take 1 tablet (5 mg total) by mouth daily.   valACYclovir 500 MG tablet Commonly known as: VALTREX Take 1 tablet by mouth as needed.   zolpidem 6.25 MG CR tablet Commonly known as: AMBIEN CR Take 6.25 mg by mouth at bedtime as needed for sleep.        Signature:  Chesley Mires, MD [place] Pager - [phone] 05/03/2022, 4:40 PM

## 2022-05-03 NOTE — Patient Instructions (Signed)
  Will call to arrange for follow up after sleep study reviewed

## 2022-05-03 NOTE — Patient Instructions (Signed)
Full PFT Performed Today  

## 2022-05-03 NOTE — Progress Notes (Signed)
Full PFT Performed Today  

## 2022-05-04 ENCOUNTER — Other Ambulatory Visit: Payer: Self-pay | Admitting: Gastroenterology

## 2022-05-04 DIAGNOSIS — Z1211 Encounter for screening for malignant neoplasm of colon: Secondary | ICD-10-CM

## 2022-05-15 ENCOUNTER — Ambulatory Visit: Payer: No Typology Code available for payment source

## 2022-05-15 DIAGNOSIS — G4733 Obstructive sleep apnea (adult) (pediatric): Secondary | ICD-10-CM | POA: Diagnosis not present

## 2022-05-15 DIAGNOSIS — G4719 Other hypersomnia: Secondary | ICD-10-CM

## 2022-05-15 DIAGNOSIS — R0609 Other forms of dyspnea: Secondary | ICD-10-CM

## 2022-05-23 ENCOUNTER — Telehealth: Payer: Self-pay | Admitting: Pulmonary Disease

## 2022-05-23 DIAGNOSIS — G4733 Obstructive sleep apnea (adult) (pediatric): Secondary | ICD-10-CM | POA: Diagnosis not present

## 2022-05-23 NOTE — Telephone Encounter (Signed)
HST 05/15/22 >> AHI 14.8, SpO2 low 81%  Please inform her that her sleep study shows mild obstructive sleep apnea.  Please arrange for ROV with me or NP to discuss treatment options.

## 2022-05-23 NOTE — Telephone Encounter (Signed)
Spoke with patient regarding HST results. They verbalized understanding. No further questions. Scheduled an ov at [place] location for 07/07/22 9:45 (next available)  Nothing further needed at this time.

## 2022-07-07 ENCOUNTER — Encounter (HOSPITAL_BASED_OUTPATIENT_CLINIC_OR_DEPARTMENT_OTHER): Payer: Self-pay | Admitting: Pulmonary Disease

## 2022-07-07 ENCOUNTER — Ambulatory Visit (INDEPENDENT_AMBULATORY_CARE_PROVIDER_SITE_OTHER): Payer: No Typology Code available for payment source | Admitting: Pulmonary Disease

## 2022-07-07 VITALS — BP 124/84 | HR 70 | Temp 98.3°F | Ht 65.0 in | Wt 205.8 lb

## 2022-07-07 DIAGNOSIS — G4733 Obstructive sleep apnea (adult) (pediatric): Secondary | ICD-10-CM | POA: Diagnosis not present

## 2022-07-07 DIAGNOSIS — Z7189 Other specified counseling: Secondary | ICD-10-CM | POA: Diagnosis not present

## 2022-07-07 NOTE — Patient Instructions (Signed)
Will arrange for auto CPAP set up  Follow up in 4 months 

## 2022-07-07 NOTE — Progress Notes (Signed)
[facility]  Chief Complaint  Patient presents with   Follow-up    Pt states she would like to ask if he sleeping habits have anything to do with her migraines every morning.     Past Surgical History:  She  has a past surgical history that includes Appendectomy; Fracture surgery; Partial hysterectomy (11/16/2010); and Colonoscopy.  Past Medical History:  Allergies, Anemia, Anxiety, OA, Depression, GERD, HLD, HTN, IBS, Migraine headache, Vit D deficiency, TMJ  Constitutional:  BP 124/84 (BP Location: Right Arm, Patient Position: Sitting, Cuff Size: Normal)   Pulse 70   Temp 98.3 F (36.8 C) (Oral)   Ht '5\' 5"'$  (1.651 m)   Wt 205 lb 12.8 oz (93.4 kg)   LMP 09/29/2010   SpO2 100%   BMI 34.25 kg/m   Brief Summary:  Beverly Rogers is a 51 y.o. female with obstructive sleep apnea.      Subjective:   Home sleep study from November showed moderate to severe sleep apnea.  She still has sleep difficulties and headaches.  She has hx of TMJ.  Physical Exam:   Appearance - well kempt   ENMT - no sinus tenderness, no oral exudate, no LAN, Mallampati 4 airway, no stridor  Respiratory - equal breath sounds bilaterally, no wheezing or rales  CV - s1s2 regular rate and rhythm, no murmurs  Ext - no clubbing, no edema  Skin - no rashes  Psych - normal mood and affect   Pulmonary testing:  PFT 05/03/22 >> FEV1 2.47 (87%), FEV1% 88, TLC 5.26 (101%), DLCO 76%  Sleep Tests:  HST 05/15/22 >> AHI 14.8, SpO2 low 81%   Social History:  She  reports that she has never smoked. She has never been exposed to tobacco smoke. She has never used smokeless tobacco. She reports that she does not drink alcohol and does not use drugs.  Family History:  Her family history includes Hyperlipidemia in her father and mother; Hypertension in her father and mother.     Assessment/Plan:   Obstructive sleep apnea. - sleep study reviewed - treatment  options discussed - reviewed how sleep apnea can impact her health - discussed importance of weight loss - will arrange for auto CPAP set up   Time Spent Involved in Patient Care on Day of Examination:  26 minutes  Follow up:   Patient Instructions  Will arrange for auto CPAP set up  Follow up in 4 months  Medication List:   Allergies as of 07/07/2022       Reactions   Bactrim [sulfamethoxazole-trimethoprim] Hives   Ciprofloxacin Rash   Lisinopril Cough        Medication List        Accurate as of July 07, 2022 10:34 AM. If you have any questions, ask your nurse or doctor.          albuterol 108 (90 Base) MCG/ACT inhaler Commonly known as: VENTOLIN HFA INHALE 2 PUFFS INTO THE LUNGS EVERY 4 HOURS AS NEEDED FOR WHEEZING OR SHORTNESS OF BREATH.   amLODipine 10 MG tablet Commonly known as: NORVASC Take 0.5 tablets (5 mg total) by mouth daily.   cyclobenzaprine 10 MG tablet Commonly known as: FLEXERIL TAKE 1 TABLET BY MOUTH THREE TIMES A DAY AS NEEDED FOR MUSCLE SPASMS   estradiol 0.025 mg/24hr patch Commonly known as: CLIMARA - Dosed in mg/24 hr Place 1 patch (0.025 mg total) onto the skin 2 (two) times a week.  famotidine 40 MG tablet Commonly known as: Pepcid Take 1 tablet (40 mg total) by mouth at bedtime.   FLUoxetine 40 MG capsule Commonly known as: PROZAC Take 1 capsule (40 mg total) by mouth 2 (two) times daily.   halobetasol 0.05 % cream Commonly known as: ULTRAVATE Apply topically 2 (two) times daily as needed. For eczema   Iron (Ferrous Sulfate) 325 (65 Fe) MG Tabs Take 325 mg by mouth daily.   ketoconazole 2 % cream Commonly known as: NIZORAL Apply 1 application topically 2 (two) times daily as needed for irritation (yeast infection).   linaclotide 290 MCG Caps capsule Commonly known as: Linzess Take 1 capsule (290 mcg total) by mouth daily before breakfast. Please schedule a yearly follow up visit for further refills. Thank  you.   LORazepam 0.5 MG tablet Commonly known as: ATIVAN TAKE 1 TABLET BY MOUTH EVERY 8 HOURS AS NEEDED FOR ANXIETY   losartan-hydrochlorothiazide 50-12.5 MG tablet Commonly known as: HYZAAR Take 1 tablet by mouth daily.   metoprolol succinate 100 MG 24 hr tablet Commonly known as: TOPROL-XL TAKE 1 TABLET BY MOUTH DAILY. TAKE WITH OR IMMEDIATELY FOLLOWING A MEAL.   omeprazole 40 MG capsule Commonly known as: PRILOSEC TAKE 1 CAPSULE BY MOUTH EVERY DAY   promethazine 25 MG tablet Commonly known as: PHENERGAN TAKE 1 TABLET (25 MG TOTAL) BY MOUTH EVERY 4 (FOUR) HOURS AS NEEDED FOR UP TO 7 DAYS FOR NAUSEA.   rizatriptan 10 MG tablet Commonly known as: MAXALT May repeat in 2 hours if needed   solifenacin 5 MG tablet Commonly known as: VESICARE Take 1 tablet (5 mg total) by mouth daily.   valACYclovir 500 MG tablet Commonly known as: VALTREX Take 1 tablet by mouth as needed.   zolpidem 6.25 MG CR tablet Commonly known as: AMBIEN CR Take 6.25 mg by mouth at bedtime as needed for sleep.        Signature:  Chesley Mires, MD [place] Pager - [phone] 07/07/2022, 10:34 AM

## 2022-07-28 ENCOUNTER — Other Ambulatory Visit: Payer: Self-pay | Admitting: Family Medicine

## 2022-07-28 DIAGNOSIS — D649 Anemia, unspecified: Secondary | ICD-10-CM

## 2022-08-26 ENCOUNTER — Other Ambulatory Visit: Payer: Self-pay | Admitting: Family Medicine

## 2022-09-07 ENCOUNTER — Other Ambulatory Visit: Payer: Self-pay | Admitting: Family Medicine

## 2022-09-07 NOTE — Telephone Encounter (Signed)
Pt LOV was on 02/02/22 Last refill was done on 12/30/21 Please advise

## 2022-12-20 ENCOUNTER — Telehealth: Payer: Self-pay | Admitting: Family Medicine

## 2022-12-20 ENCOUNTER — Encounter: Payer: Self-pay | Admitting: Family Medicine

## 2022-12-20 ENCOUNTER — Ambulatory Visit: Payer: No Typology Code available for payment source | Admitting: Family Medicine

## 2022-12-20 VITALS — BP 138/82 | HR 59 | Temp 98.3°F | Wt 218.0 lb

## 2022-12-20 DIAGNOSIS — M25571 Pain in right ankle and joints of right foot: Secondary | ICD-10-CM

## 2022-12-20 DIAGNOSIS — Z0000 Encounter for general adult medical examination without abnormal findings: Secondary | ICD-10-CM | POA: Diagnosis not present

## 2022-12-20 DIAGNOSIS — G8929 Other chronic pain: Secondary | ICD-10-CM

## 2022-12-20 LAB — BASIC METABOLIC PANEL
BUN: 11 mg/dL (ref 6–23)
CO2: 27 mEq/L (ref 19–32)
Calcium: 9.3 mg/dL (ref 8.4–10.5)
Chloride: 105 mEq/L (ref 96–112)
Creatinine, Ser: 0.7 mg/dL (ref 0.40–1.20)
GFR: 98.38 mL/min (ref >60.00)
Glucose, Bld: 93 mg/dL (ref 70–99)
Potassium: 4.1 mEq/L (ref 3.5–5.1)
Sodium: 140 mEq/L (ref 135–145)

## 2022-12-20 LAB — HEPATIC FUNCTION PANEL
ALT: 19 U/L (ref 0–35)
AST: 23 U/L (ref 0–37)
Albumin: 4.1 g/dL (ref 3.5–5.2)
Alkaline Phosphatase: 84 U/L (ref 39–117)
Bilirubin, Direct: 0.1 mg/dL (ref 0.0–0.3)
Total Bilirubin: 0.4 mg/dL (ref 0.2–1.2)
Total Protein: 7.7 g/dL (ref 6.0–8.3)

## 2022-12-20 LAB — LIPID PANEL
Cholesterol: 200 mg/dL (ref 0–200)
HDL: 36.9 mg/dL — ABNORMAL LOW (ref >39.00)
LDL Cholesterol: 123 mg/dL — ABNORMAL HIGH (ref 0–99)
NonHDL: 163.43
Total CHOL/HDL Ratio: 5
Triglycerides: 200 mg/dL — ABNORMAL HIGH (ref 0.0–149.0)
VLDL: 40 mg/dL (ref 0.0–40.0)

## 2022-12-20 LAB — CBC WITH DIFFERENTIAL/PLATELET
Basophils Absolute: 0 10*3/uL (ref 0.0–0.1)
Basophils Relative: 0.4 % (ref 0.0–3.0)
Eosinophils Absolute: 0.2 10*3/uL (ref 0.0–0.7)
Eosinophils Relative: 3.7 % (ref 0.0–5.0)
HCT: 36.3 % (ref 36.0–46.0)
Hemoglobin: 11.1 g/dL — ABNORMAL LOW (ref 12.0–15.0)
Lymphocytes Relative: 47.9 % — ABNORMAL HIGH (ref 12.0–46.0)
Lymphs Abs: 2 10*3/uL (ref 0.7–4.0)
MCHC: 30.7 g/dL (ref 30.0–36.0)
MCV: 81.4 fl (ref 78.0–100.0)
Monocytes Absolute: 0.5 10*3/uL (ref 0.1–1.0)
Monocytes Relative: 12.4 % — ABNORMAL HIGH (ref 3.0–12.0)
Neutro Abs: 1.5 10*3/uL (ref 1.4–7.7)
Neutrophils Relative %: 35.6 % — ABNORMAL LOW (ref 43.0–77.0)
Platelets: 290 10*3/uL (ref 150.0–400.0)
RBC: 4.47 Mil/uL (ref 3.87–5.11)
RDW: 15.4 % (ref 11.5–15.5)
WBC: 4.1 10*3/uL (ref 4.0–10.5)

## 2022-12-20 LAB — HEMOGLOBIN A1C: Hgb A1c MFr Bld: 5.6 % (ref 4.6–6.5)

## 2022-12-20 LAB — TSH: TSH: 2.49 u[IU]/mL (ref 0.35–5.50)

## 2022-12-20 LAB — VITAMIN B12: Vitamin B-12: 275 pg/mL (ref 211–911)

## 2022-12-20 MED ORDER — SOLIFENACIN SUCCINATE 5 MG PO TABS: 5.0000 mg | ORAL_TABLET | Freq: Every day | ORAL | 3 refills | Status: AC

## 2022-12-20 MED ORDER — LOSARTAN POTASSIUM-HCTZ 50-12.5 MG PO TABS: 1.0000 | ORAL_TABLET | Freq: Every day | ORAL | 3 refills | Status: DC

## 2022-12-20 MED ORDER — METOPROLOL SUCCINATE ER 100 MG PO TB24: 100.0000 mg | ORAL_TABLET | Freq: Every day | ORAL | 3 refills | Status: DC

## 2022-12-20 MED ORDER — RIZATRIPTAN BENZOATE 10 MG PO TABS: ORAL_TABLET | ORAL | 11 refills | Status: DC

## 2022-12-20 MED ORDER — PROMETHAZINE HCL 25 MG PO TABS: 25.0000 mg | ORAL_TABLET | ORAL | 3 refills | Status: DC | PRN

## 2022-12-20 MED ORDER — FLUOXETINE HCL 40 MG PO CAPS: 40.0000 mg | ORAL_CAPSULE | Freq: Two times a day (BID) | ORAL | 3 refills | Status: AC

## 2022-12-20 NOTE — Progress Notes (Signed)
Subjective:    Patient ID: Beverly Rogers, female    DOB: 1970/10/15, 52 y.o.   MRN: [id number]  HPI Here for a well exam. She feels well in general. She uses her CPAP at night, and this has helped her sleep better. She does complain of some pain in her right ankle at the site of her fracture surgery in 2000. She takes Aleve for this.    Review of Systems  Constitutional: Negative.   HENT: Negative.    Eyes: Negative.   Respiratory: Negative.    Cardiovascular: Negative.   Gastrointestinal: Negative.   Genitourinary:  Negative for decreased urine volume, difficulty urinating, dyspareunia, dysuria, enuresis, flank pain, frequency, hematuria, pelvic pain and urgency.  Musculoskeletal:  Positive for arthralgias.  Skin: Negative.   Neurological: Negative.  Negative for headaches.  Psychiatric/Behavioral: Negative.         Objective:   Physical Exam Constitutional:      General: She is not in acute distress.    Appearance: She is well-developed. She is obese.  HENT:     Head: Normocephalic and atraumatic.     Right Ear: External ear normal.     Left Ear: External ear normal.     Nose: Nose normal.     Mouth/Throat:     Pharynx: No oropharyngeal exudate.  Eyes:     General: No scleral icterus.    Conjunctiva/sclera: Conjunctivae normal.     Pupils: Pupils are equal, round, and reactive to light.  Neck:     Thyroid: No thyromegaly.     Vascular: No JVD.  Cardiovascular:     Rate and Rhythm: Normal rate and regular rhythm.     Pulses: Normal pulses.     Heart sounds: Normal heart sounds. No murmur heard.    No friction rub. No gallop.  Pulmonary:     Effort: Pulmonary effort is normal. No respiratory distress.     Breath sounds: Normal breath sounds. No wheezing or rales.  Chest:     Chest wall: No tenderness.  Abdominal:     General: Bowel sounds are normal. There is no distension.     Palpations: Abdomen is soft. There is no mass.     Tenderness: There is no  abdominal tenderness. There is no guarding or rebound.  Musculoskeletal:        General: No tenderness. Normal range of motion.     Cervical back: Normal range of motion and neck supple.     Comments: She is tender over the right medial ankle area. No swelling. ROM is full   Lymphadenopathy:     Cervical: No cervical adenopathy.  Skin:    General: Skin is warm and dry.     Findings: No erythema or rash.  Neurological:     Mental Status: She is alert and oriented to person, place, and time.     Cranial Nerves: No cranial nerve deficit.     Motor: No abnormal muscle tone.     Coordination: Coordination normal.     Deep Tendon Reflexes: Reflexes are normal and symmetric. Reflexes normal.  Psychiatric:        Mood and Affect: Mood normal.        Behavior: Behavior normal.        Thought Content: Thought content normal.        Judgment: Judgment normal.           Assessment & Plan:  Well exam. We discussed diet and exercise. Get  fasting labs. Refer to her podiatrist, Dr. Gala Lewandowsky, to check her ankle.  Gershon Crane, MD

## 2022-12-20 NOTE — Telephone Encounter (Signed)
That's what I expected. This will not be covered

## 2022-12-20 NOTE — Telephone Encounter (Signed)
Pt called to inform MD that GLP 1 Class meds require a PA, as per [employer] insurance Administrator)

## 2022-12-27 NOTE — Telephone Encounter (Signed)
Pt was notified.  

## 2023-01-05 ENCOUNTER — Ambulatory Visit: Payer: No Typology Code available for payment source

## 2023-01-05 ENCOUNTER — Ambulatory Visit: Payer: No Typology Code available for payment source | Admitting: Podiatry

## 2023-01-05 DIAGNOSIS — M2141 Flat foot [pes planus] (acquired), right foot: Secondary | ICD-10-CM

## 2023-01-05 DIAGNOSIS — M25571 Pain in right ankle and joints of right foot: Secondary | ICD-10-CM

## 2023-01-05 DIAGNOSIS — M7751 Other enthesopathy of right foot: Secondary | ICD-10-CM | POA: Diagnosis not present

## 2023-01-05 NOTE — Progress Notes (Unsigned)
   Chief Complaint  Patient presents with   Foot Pain    Patient came in today for right ankle pain, patient had surgery 24 years ago, pain started a month ago, rate of pain 6 out of 10, X-Rays done today,     Subjective:  52 y.o. female presenting today for new complaint of pain and tenderness associated to the right ankle.  Patient has a history of ORIF to the right ankle in 2010.  She has noticed increased pain and tenderness over the past month.  Currently the pain is 6/10.  Denies any recent history of injury or change in activity.  She presents for further treatment and evaluation   Past Medical History:  Diagnosis Date   Allergy    Anemia    "many years ago"   Anxiety    Arthritis    back,neck   Depression    post partum   GERD (gastroesophageal reflux disease)    Hx of appendectomy    Hyperlipidemia    Hypertension    IBS (irritable bowel syndrome)    Migraine    Vitamin D deficiency     Past Surgical History:  Procedure Laterality Date   APPENDECTOMY     COLONOSCOPY  03/08/2022   per Dr. Adela Lank, adenomatous polyp, repeat in 7 yrs   FRACTURE SURGERY Right 2000   repair in the rt lower leg screwsand plate in place   PARTIAL HYSTERECTOMY  11/16/2010    Allergies  Allergen Reactions   Bactrim [Sulfamethoxazole-Trimethoprim] Hives   Ciprofloxacin Rash   Lisinopril Cough    Objective / Physical Exam:  General:  The patient is alert and oriented x3 in no acute distress. Dermatology:  Skin is warm, dry and supple bilateral lower extremities. Negative for open lesions or macerations. Vascular:  Palpable pedal pulses bilaterally. No edema or erythema noted. Capillary refill within normal limits. Neurological:  Grossly intact via light touch Musculoskeletal Exam:  Pain on palpation to the medial aspect of the patient's right ankle. Mild edema noted. Range of motion within normal limits to all pedal and ankle joints bilateral. Muscle strength 5/5 in all  groups bilateral.   Radiographic Exam RT ankle and foot 01/05/2023:  Normal osseous mineralization. Joint spaces mostly preserved.  Orthopedic hardware around the ankle joint appears stable and intact.  No apparent degenerative changes noted to the right ankle.  There is some medial deviation of the talar head on AP view of the foot consistent with moderate flatfoot deformity  Assessment: 1.  Capsulitis/arthritis right ankle 2.  History of ORIF RT ankle 2010 3.  Flatfoot right  Plan of Care:  -Patient was evaluated. X-Rays reviewed.  -Injection of 0.5 mL Celestone Soluspan injected in the patient's right ankle. -Advised against going barefoot.  Recommend good supportive chain shoes and sneakers -Patient takes Aleve OTC.  Continue as needed -Ankle brace dispensed.  Weightbearing as tolerated in good supportive tennis shoes and sneakers -Return to clinic as needed   Felecia Shelling, DPM [facility]  Dr. Felecia Shelling, DPM    [address] [place], [place] [address]                Office [phone]  Fax [phone]

## 2023-01-07 DIAGNOSIS — M7751 Other enthesopathy of right foot: Secondary | ICD-10-CM

## 2023-01-07 MED ORDER — BETAMETHASONE SOD PHOS & ACET 6 (3-3) MG/ML IJ SUSP
3.0000 mg | Freq: Once | INTRAMUSCULAR | Status: AC
Start: 2023-01-07 — End: 2023-01-07
  Administered 2023-01-07: 3 mg via INTRA_ARTICULAR

## 2023-01-14 ENCOUNTER — Other Ambulatory Visit: Payer: Self-pay | Admitting: Family Medicine

## 2023-04-23 ENCOUNTER — Encounter: Payer: Self-pay | Admitting: Family Medicine

## 2023-04-23 ENCOUNTER — Ambulatory Visit: Payer: No Typology Code available for payment source | Admitting: Family Medicine

## 2023-04-23 VITALS — BP 126/84 | HR 67 | Temp 98.6°F | Wt 228.0 lb

## 2023-04-23 DIAGNOSIS — G8929 Other chronic pain: Secondary | ICD-10-CM | POA: Diagnosis not present

## 2023-04-23 DIAGNOSIS — M545 Low back pain, unspecified: Secondary | ICD-10-CM | POA: Diagnosis not present

## 2023-04-23 MED ORDER — CYCLOBENZAPRINE HCL 10 MG PO TABS: 10.0000 mg | ORAL_TABLET | Freq: Three times a day (TID) | ORAL | 2 refills | Status: DC | PRN

## 2023-04-23 MED ORDER — DICLOFENAC SODIUM 75 MG PO TBEC: 75.0000 mg | DELAYED_RELEASE_TABLET | Freq: Two times a day (BID) | ORAL | 2 refills | Status: DC

## 2023-04-23 NOTE — Progress Notes (Signed)
   Subjective:    Patient ID: Beverly Rogers, female    DOB: [birth year], 52 y.o.   MRN: [id number]  HPI Here for several months of tightness and pain in the lower back. No radiation of pain to the legs. She came to [place] for this 2 years ago, and we got Xrays of her lumbar spine. These showed facet arthropathy. No hx of trauma. She is taking Aleve and Tylenol with mixed results.    Review of Systems  Constitutional: Negative.   Respiratory: Negative.    Cardiovascular: Negative.   Musculoskeletal:  Positive for back pain.       Objective:   Physical Exam Constitutional:      General: She is not in acute distress.    Appearance: Normal appearance.  Cardiovascular:     Rate and Rhythm: Normal rate and regular rhythm.     Pulses: Normal pulses.     Heart sounds: Normal heart sounds.  Pulmonary:     Effort: Pulmonary effort is normal.     Breath sounds: Normal breath sounds.  Musculoskeletal:     Comments: She is tender over the lower back with a fair amount of spasm. ROM is limited by pain  Neurological:     Mental Status: She is alert.           Assessment & Plan:  Low back pain. She will try Diclofenac BID and Flexeril TID as needed. Follow up as needed.  Gershon Crane, MD

## 2023-05-03 ENCOUNTER — Encounter: Payer: Self-pay | Admitting: Family Medicine

## 2023-05-04 MED ORDER — FUROSEMIDE 20 MG PO TABS: 20.0000 mg | ORAL_TABLET | Freq: Every day | ORAL | 3 refills | Status: DC | PRN

## 2023-05-04 NOTE — Telephone Encounter (Signed)
I am glad she is feeling better. Call in Lasix 20 mg daily as needed for fluid, #90 with 3 rf

## 2023-11-01 ENCOUNTER — Ambulatory Visit: Payer: Self-pay

## 2023-11-01 NOTE — Telephone Encounter (Signed)
 Chief Complaint: Back pain Symptoms: "cracking and popping" intermittently with bending Frequency: Ongoing x 1 month Pertinent Negatives: Patient denies numbness, weakness, neurological symptoms Disposition: [] ED /[] Urgent Care (no appt availability in office) / [x] Appointment(In office/virtual)/ []  [facility]/ [] Home Care/ [] Refused Recommended Disposition /[] [facility]/ []  Follow-up with PCP Additional Notes: Pt reports she has been experiencing worsening mid-low back pain for approx 1 month. Pt reports she has a high pain tolerance but it is affecting her when ambulating. Pt denies numbness, weakness, urinary symptoms. OV scheduled. This RN educated pt on home care, new-worsening symptoms, when to call back/seek emergent care. Pt verbalized understanding and agrees to plan.     Copied from CRM [id number]. Topic: Clinical - Red Word Triage >> Nov 01, 2023  1:38 PM Kita Perish H wrote: [facility] that prompted transfer to Nurse Triage: Backaches getting worse, when she bends over hears cracking or popping noise. Reason for Disposition  Back pain is a chronic symptom (recurrent or ongoing AND present > 4 weeks)  Answer Assessment - Initial Assessment Questions 1. ONSET: "When did the pain begin?"      X 1 month ago 2. LOCATION: "Where does it hurt?" (upper, mid or lower back)     Low back, primarily center 3. SEVERITY: "How bad is the pain?"  (e.g., Scale 1-10; mild, moderate, or severe)   - MILD (1-3): Doesn't interfere with normal activities.    - MODERATE (4-7): Interferes with normal activities or awakens from sleep.    - SEVERE (8-10): Excruciating pain, unable to do any normal activities.      Pt reports high tolerance for pain but does sometimes affect gait 4. PATTERN: "Is the pain constant?" (e.g., yes, no; constant, intermittent)      Constant 5. RADIATION: "Does the pain shoot into your legs or somewhere else?"     None 6. CAUSE:  "What do you think is  causing the back pain?"      Pt has history of arthritis 8. MEDICINES: "What have you taken so far for the pain?" (e.g., nothing, acetaminophen , NSAIDS)     Advil/Aleve AM and PM, little to no improvement 9. NEUROLOGIC SYMPTOMS: "Do you have any weakness, numbness, or problems with bowel/bladder control?"     None 10. OTHER SYMPTOMS: "Do you have any other symptoms?" (e.g., fever, abdomen pain, burning with urination, blood in urine)       "Cracking and popping" when bending over  Protocols used: Back Pain-A-AH

## 2023-11-05 ENCOUNTER — Encounter (HOSPITAL_COMMUNITY): Payer: Self-pay

## 2023-11-05 ENCOUNTER — Emergency Department (HOSPITAL_COMMUNITY)
Admission: EM | Admit: 2023-11-05 | Discharge: 2023-11-05 | Disposition: A | Discharge status: Home / Self Care | Attending: Emergency Medicine | Admitting: Emergency Medicine

## 2023-11-05 ENCOUNTER — Other Ambulatory Visit: Payer: Self-pay

## 2023-11-05 DIAGNOSIS — T426X1A Poisoning by other antiepileptic and sedative-hypnotic drugs, accidental (unintentional), initial encounter: Secondary | ICD-10-CM | POA: Insufficient documentation

## 2023-11-05 DIAGNOSIS — X58XXXA Exposure to other specified factors, initial encounter: Secondary | ICD-10-CM | POA: Insufficient documentation

## 2023-11-05 DIAGNOSIS — T50901A Poisoning by unspecified drugs, medicaments and biological substances, accidental (unintentional), initial encounter: Secondary | ICD-10-CM | POA: Diagnosis present

## 2023-11-05 NOTE — Discharge Instructions (Addendum)
 Return if any problems.

## 2023-11-05 NOTE — ED Notes (Addendum)
 Poison Control contacted: recommend 4 hours observation from time of ingestion.

## 2023-11-05 NOTE — ED Provider Notes (Signed)
 [place] EMERGENCY DEPARTMENT AT [facility] Provider Note   CSN: [id number] Arrival date & time: 11/05/23  1828     History  Chief Complaint  Patient presents with   Medication Reaction    Beverly Rogers is a 53 y.o. female.  Patient reports that she was going to take her migraine medicine and accidentally took 40 mg of Ambien.  Patient complains of feeling sleepy.  Patient states that she keeps the bottles on her table and she got confused.  The triage nurse spoke to poison control who advised observation for 4 hours.  Patient took the medication around 7:00.  Patient denies any other complaints.  Patient denies taking any other medications.  Patient states that this was accidental she had no intentions of harming herself.  Patient is here with her son who reports overdose was accidental.  Patient denies any shortness of breath.  She is not having any difficulty breathing.  Patient denies any weakness.  Vision is normal hearing is normal.  Patient denies any difficulty breathing  The history is provided by the patient. No language interpreter was used.       Home Medications Prior to Admission medications   Medication Sig Start Date End Date Taking? Authorizing Provider  albuterol  (VENTOLIN  HFA) 108 (90 Base) MCG/ACT inhaler INHALE 2 PUFFS BY MOUTH EVERY 4 HOURS AS NEEDED FOR WHEEZE OR FOR SHORTNESS OF BREATH 01/15/23   Donley Furth, MD  cyclobenzaprine  (FLEXERIL ) 10 MG tablet TAKE 1 TABLET BY MOUTH THREE TIMES A DAY AS NEEDED FOR MUSCLE SPASMS 06/30/20   Donley Furth, MD  cyclobenzaprine  (FLEXERIL ) 10 MG tablet Take 1 tablet (10 mg total) by mouth 3 (three) times daily as needed for muscle spasms. 04/23/23   Donley Furth, MD  diclofenac  (VOLTAREN ) 75 MG EC tablet Take 1 tablet (75 mg total) by mouth 2 (two) times daily. 04/23/23   Donley Furth, MD  estradiol  (CLIMARA  - DOSED IN MG/24 HR) 0.025 mg/24hr patch Place 1 patch (0.025 mg total) onto the skin 2 (two)  times a week. 11/05/17   Donley Furth, MD  famotidine  (PEPCID ) 40 MG tablet Take 1 tablet (40 mg total) by mouth at bedtime. 09/08/20   Donley Furth, MD  ferrous sulfate  325 (65 FE) MG tablet TAKE 325 MG BY MOUTH DAILY. 07/28/22   Donley Furth, MD  FLUoxetine  (PROZAC ) 40 MG capsule Take 1 capsule (40 mg total) by mouth 2 (two) times daily. 12/20/22   Donley Furth, MD  furosemide  (LASIX ) 20 MG tablet Take 1 tablet (20 mg total) by mouth daily as needed for fluid or edema. 05/04/23   Donley Furth, MD  halobetasol  (ULTRAVATE ) 0.05 % cream Apply topically 2 (two) times daily as needed. For eczema 11/06/16   Donley Furth, MD  ketoconazole  (NIZORAL ) 2 % cream Apply 1 application topically 2 (two) times daily as needed for irritation (yeast infection). 11/06/16   Donley Furth, MD  LORazepam  (ATIVAN ) 0.5 MG tablet TAKE 1 TABLET BY MOUTH EVERY 8 HOURS AS NEEDED FOR ANXIETY 09/07/22   Donley Furth, MD  losartan -hydrochlorothiazide  (HYZAAR) 50-12.5 MG tablet Take 1 tablet by mouth daily. 12/20/22   Donley Furth, MD  metoprolol  succinate (TOPROL -XL) 100 MG 24 hr tablet Take 1 tablet (100 mg total) by mouth daily. TAKE WITH OR IMMEDIATELY FOLLOWING A MEAL. 12/20/22   Donley Furth, MD  omeprazole  (PRILOSEC) 40 MG capsule TAKE 1 CAPSULE BY MOUTH  EVERY DAY 03/24/22   Armbruster, Lendon Queen, MD  promethazine  (PHENERGAN ) 25 MG tablet Take 1 tablet (25 mg total) by mouth every 4 (four) hours as needed for nausea. 12/20/22   Donley Furth, MD  rizatriptan  (MAXALT ) 10 MG tablet TAKE 1 TABLET BY MOUTH ONCE AS NEEDED. MAY REPEAT IN 2 HOURS IF NEEDED 12/20/22   Donley Furth, MD  solifenacin  (VESICARE ) 5 MG tablet Take 1 tablet (5 mg total) by mouth daily. 12/20/22   Donley Furth, MD  valACYclovir (VALTREX) 500 MG tablet Take 1 tablet by mouth as needed. 01/21/20   [provider]  zolpidem (AMBIEN CR) 6.25 MG CR tablet Take 6.25 mg by mouth at bedtime as needed for sleep.    [provider]       Allergies    Bactrim  [sulfamethoxazole -trimethoprim ], Ciprofloxacin, and Lisinopril     Review of Systems   Review of Systems  All other systems reviewed and are negative.   Physical Exam Updated Vital Signs BP (!) 176/104   Pulse 72   Temp 98.5 F (36.9 C) (Oral)   Resp 16   Ht 5\' 5"  (1.651 m)   Wt 97.5 kg   LMP 09/29/2010   SpO2 99%   BMI 35.78 kg/m  Physical Exam Vitals and nursing note reviewed.  Constitutional:      Appearance: She is well-developed.  HENT:     Head: Normocephalic.     Mouth/Throat:     Mouth: Mucous membranes are moist.  Cardiovascular:     Rate and Rhythm: Normal rate.  Pulmonary:     Effort: Pulmonary effort is normal.  Abdominal:     General: There is no distension.  Musculoskeletal:        General: Normal range of motion.     Cervical back: Normal range of motion.  Skin:    General: Skin is warm.  Neurological:     General: No focal deficit present.     Mental Status: She is alert and oriented to person, place, and time.  Psychiatric:        Mood and Affect: Mood normal.     ED Results / Procedures / Treatments   Labs (all labs ordered are listed, but only abnormal results are displayed) Labs Reviewed - No data to display  EKG None  Radiology No results found.  Procedures Procedures    Medications Ordered in ED Medications - No data to display  ED Course/ Medical Decision Making/ A&P                                 Medical Decision Making Pt accidentally took 40 mg of ambien.  Pt was trying to take            Final Clinical Impression(s) / ED Diagnoses Final diagnoses:  Overdose, accidental or unintentional, initial encounter    Rx / DC Orders ED Discharge Orders     None      An After Visit Summary was printed and given to the patient.    Sandi Crosby, PA-C 11/05/23 2242    Sallyanne Creamer, DO 11/12/23 1138

## 2023-11-05 NOTE — ED Triage Notes (Signed)
 Pt meant to take medication for migraines and took 40mg  of Ambien, pt c/o being tired, no other sx.

## 2023-11-06 ENCOUNTER — Telehealth: Payer: Self-pay

## 2023-11-06 ENCOUNTER — Ambulatory Visit: Admitting: Family Medicine

## 2023-11-06 ENCOUNTER — Other Ambulatory Visit (HOSPITAL_COMMUNITY): Payer: Self-pay

## 2023-11-06 ENCOUNTER — Encounter: Payer: Self-pay | Admitting: Family Medicine

## 2023-11-06 VITALS — BP 136/84 | HR 75 | Temp 98.6°F | Wt 218.0 lb

## 2023-11-06 DIAGNOSIS — G8929 Other chronic pain: Secondary | ICD-10-CM

## 2023-11-06 DIAGNOSIS — G43909 Migraine, unspecified, not intractable, without status migrainosus: Secondary | ICD-10-CM

## 2023-11-06 DIAGNOSIS — I10 Essential (primary) hypertension: Secondary | ICD-10-CM

## 2023-11-06 DIAGNOSIS — M545 Low back pain, unspecified: Secondary | ICD-10-CM | POA: Diagnosis not present

## 2023-11-06 DIAGNOSIS — E669 Obesity, unspecified: Secondary | ICD-10-CM | POA: Diagnosis not present

## 2023-11-06 MED ORDER — OZEMPIC (0.25 OR 0.5 MG/DOSE) 2 MG/3ML ~~LOC~~ SOPN: 0.2500 mg | PEN_INJECTOR | SUBCUTANEOUS | 2 refills | Status: DC

## 2023-11-06 MED ORDER — CYCLOBENZAPRINE HCL 10 MG PO TABS: 10.0000 mg | ORAL_TABLET | Freq: Three times a day (TID) | ORAL | 5 refills | Status: AC | PRN

## 2023-11-06 MED ORDER — PROMETHAZINE HCL 25 MG PO TABS: 25.0000 mg | ORAL_TABLET | ORAL | 3 refills | Status: AC | PRN

## 2023-11-06 NOTE — Telephone Encounter (Signed)
 Pharmacy Patient Advocate Encounter   Received notification from CoverMyMeds that prior authorization for Ozempic (0.25 or 0.5 MG/DOSE) 2MG /3ML pen-injectors is required/requested.     Ozempic/Mounjaro is approved exclusively as an adjunct to diet and exercise to improve glycemic control in adults with type 2 diabetes mellitus.   A review of patient's medical chart reveals no documented diagnosis of type 2 diabetes or an A1C indicative of diabetes. Therefore, they do not currently meet the criteria for prior authorization of this medication. If clinically appropriate, alternative options such as Saxenda, Zepbound, or Frederik Jansky may be considered for this patient.

## 2023-11-06 NOTE — Progress Notes (Signed)
   Subjective:    Patient ID: Beverly Rogers, female    DOB: 1971-03-25, 53 y.o.   MRN: [id number]  HPI Here for several issues. First she was in the ED yesterday for an accidental overdose of Zolpidem. She says she intended to take 4 Lasix  pills for swelling in her feet but she mistakenly took 4 Zolpidem pills instead. She felt okay other than feeling very sleepy. Her son drove her to the ED where she was observed for several hours. No testing was done. She was then taken home by her son. Today she feels back to normal. Also she asks us  to write a letter for her to take to her job to allow her to work in a quiet room with no artifical lighting because of her migraines. In addition she has been having more low back pain lately. She takes Aleve and Flexeril  with mixed results. She says her work offers a PT program that she could enroll in of this was a good idea. She says she has lost 10 lbs since last October by eating smaller portions and by fasting intermittently. She has stopped taking Losartan  and Metoprolol  because her BP was dropping too low. She now averages readings of 130's over 80's at home. Lastly she asks us  to start her on a GLP-1 medication to help her lose more weight.    Review of Systems  Constitutional: Negative.   Respiratory: Negative.    Cardiovascular: Negative.   Musculoskeletal:  Positive for back pain.  Neurological:  Positive for headaches.       Objective:   Physical Exam Constitutional:      Appearance: She is obese.  Cardiovascular:     Rate and Rhythm: Normal rate and regular rhythm.     Pulses: Normal pulses.     Heart sounds: Normal heart sounds.  Pulmonary:     Effort: Pulmonary effort is normal.     Breath sounds: Normal breath sounds.  Neurological:     Mental Status: She is alert.           Assessment & Plan:  She has recovered from an accidental overdose of Zolpidem. We wrote a letter for her to work in a quiet dark room at her job. Her HTN  is now well controlled without medications. I agreed that she could take advantage of her work related PT program for the back pain. For the obesity, we sent in a RX for Ozempic. We spent a total of ( 35  ) minutes reviewing records and discussing these issues.  Beverly Diego, MD

## 2023-11-08 NOTE — Telephone Encounter (Signed)
 Pt was seen by Dr Alyne Babinski on 11/06/23

## 2023-11-13 ENCOUNTER — Other Ambulatory Visit (HOSPITAL_COMMUNITY): Payer: Self-pay

## 2023-11-13 ENCOUNTER — Telehealth: Payer: Self-pay

## 2023-11-13 MED ORDER — WEGOVY 0.25 MG/0.5ML ~~LOC~~ SOAJ: 0.2500 mg | SUBCUTANEOUS | 5 refills | Status: DC

## 2023-11-13 NOTE — Telephone Encounter (Signed)
 Pt.notified

## 2023-11-13 NOTE — Telephone Encounter (Signed)
 I cancelled the Ozempic  and instead sent in [facility]

## 2023-11-13 NOTE — Telephone Encounter (Signed)
 FYI

## 2023-11-13 NOTE — Addendum Note (Signed)
 Addended by: [name] A on: 11/13/2023 11:11 AM   Modules accepted: Orders

## 2023-11-13 NOTE — Telephone Encounter (Signed)
 Pharmacy Patient Advocate Encounter   Received notification from Pt Calls Messages that prior authorization for [facility] is required/requested.   Insurance verification completed.   The patient is insured through [employer] .   Per test claim: PA required; PA submitted to above mentioned insurance via CoverMyMeds Key/confirmation #/EOC [id number] Status is pending

## 2023-11-20 ENCOUNTER — Telehealth: Payer: Self-pay

## 2023-11-20 ENCOUNTER — Other Ambulatory Visit (HOSPITAL_COMMUNITY): Payer: Self-pay

## 2023-11-20 NOTE — Telephone Encounter (Signed)
 error

## 2023-11-27 ENCOUNTER — Telehealth: Payer: Self-pay

## 2023-11-27 ENCOUNTER — Telehealth: Payer: Self-pay | Admitting: Family Medicine

## 2023-11-27 ENCOUNTER — Other Ambulatory Visit (HOSPITAL_COMMUNITY): Payer: Self-pay

## 2023-11-27 NOTE — Telephone Encounter (Signed)
 PA request has been resubmitted. New Encounter has been or will be created for follow up. For additional info see Pharmacy Prior Auth telephone encounter from 11/13/23. Cover my meds key:[id number].

## 2023-11-27 NOTE — Telephone Encounter (Signed)
 Copied from CRM [id number]. Topic: General - Other >> Nov 27, 2023  2:00 PM Martinique E wrote: Reason for CRM: Patient called in wanting to speak with PCP's nurse in regards to some paperwork that she dropped off last week. Callback number for patient is [phone].

## 2023-11-28 ENCOUNTER — Other Ambulatory Visit (HOSPITAL_COMMUNITY): Payer: Self-pay

## 2023-11-28 ENCOUNTER — Telehealth: Payer: Self-pay

## 2023-11-28 ENCOUNTER — Telehealth: Payer: Self-pay | Admitting: Family Medicine

## 2023-11-28 NOTE — Telephone Encounter (Signed)
 Left pt a message advised that the Disability forms have been faxed to [facility]. Copy of form is on Gwyneth Fernandez's desk

## 2023-11-28 NOTE — Telephone Encounter (Signed)
 Ozempic Florence Hunt is approved exclusively as an adjunct to diet and exercise to improve glycemic control in adults with type 2 diabetes mellitus. A review of patient's medical chart reveals no documented diagnosis of type 2 diabetes or an A1C indicative of diabetes. Therefore, they do not currently meet the criteria for prior authorization of this medication. If clinically appropriate, alternative options such as Saxenda, Zepbound, or Wegovy  may be considered for this patient.  Patient has a prior authorization fro Wegovy  currently pending Cover My Meds [id number]

## 2023-11-28 NOTE — Telephone Encounter (Signed)
 Copied from CRM [id number]. Topic: Clinical - Medical Advice >> Nov 28, 2023 12:11 PM Shereese L wrote: Reason for CRM: patient dropped off work accomodation paper work on 05/13 and the due date for the papers are 05/21 and wanted to know if she can pick up the paperwork. Patient needs a call back asap

## 2023-11-28 NOTE — Telephone Encounter (Signed)
 Pt paperwork is awaiting completing, pt will be notified when ready for pick up

## 2023-11-28 NOTE — Telephone Encounter (Signed)
 Pt form faxed to [facility] and a copy is at the office

## 2023-11-29 ENCOUNTER — Telehealth: Payer: Self-pay | Admitting: *Deleted

## 2023-11-29 NOTE — Telephone Encounter (Signed)
 Copied from CRM [id number]. Topic: General - Other >> Nov 29, 2023  3:50 PM Luane Rumps D wrote: Reason for CRM: Patient called to notify that she would like to pick up the copy of paperwork today and her work said that the paperwork was "illegible". She said she can come after work before 5 to get it.

## 2023-11-30 ENCOUNTER — Other Ambulatory Visit (HOSPITAL_COMMUNITY): Payer: Self-pay

## 2023-11-30 NOTE — Telephone Encounter (Signed)
 Resubmitting for Wegovy  0.25. Key [id number]

## 2023-11-30 NOTE — Telephone Encounter (Signed)
 Pt picked up paperwork form the office on 11/29/23

## 2023-11-30 NOTE — Telephone Encounter (Signed)
 Pt picked up paperwork from the office on 11/29/23

## 2023-12-07 NOTE — Telephone Encounter (Signed)
 Resubmitted again today, CMM [id number]

## 2023-12-13 ENCOUNTER — Other Ambulatory Visit (HOSPITAL_COMMUNITY): Payer: Self-pay

## 2023-12-13 NOTE — Telephone Encounter (Signed)
 Resubmitted again. CMM Key [id number]

## 2023-12-18 ENCOUNTER — Other Ambulatory Visit (HOSPITAL_COMMUNITY): Payer: Self-pay

## 2023-12-20 ENCOUNTER — Other Ambulatory Visit (HOSPITAL_COMMUNITY): Payer: Self-pay

## 2023-12-20 NOTE — Telephone Encounter (Signed)
 Resubmitted again [facility]

## 2023-12-20 NOTE — Telephone Encounter (Signed)
 Pharmacy Patient Advocate Encounter  Received notification from [employer] that Prior Authorization for Wegovy  has been DENIED.  Full denial letter will be uploaded to the media tab. See denial reason below.   PA #/Case ID/Reference #: [id number] Best

## 2023-12-21 ENCOUNTER — Other Ambulatory Visit (HOSPITAL_COMMUNITY): Payer: Self-pay

## 2023-12-24 NOTE — Telephone Encounter (Signed)
FYI.  Pt notified

## 2024-02-06 ENCOUNTER — Encounter: Payer: Self-pay | Admitting: Family Medicine

## 2024-02-06 ENCOUNTER — Ambulatory Visit: Admitting: Family Medicine

## 2024-02-06 VITALS — BP 124/82 | HR 95 | Temp 98.5°F | Wt 220.0 lb

## 2024-02-06 DIAGNOSIS — Z13220 Encounter for screening for lipoid disorders: Secondary | ICD-10-CM

## 2024-02-06 DIAGNOSIS — Z131 Encounter for screening for diabetes mellitus: Secondary | ICD-10-CM

## 2024-02-06 DIAGNOSIS — Z0000 Encounter for general adult medical examination without abnormal findings: Secondary | ICD-10-CM | POA: Diagnosis not present

## 2024-02-06 LAB — CBC WITH DIFFERENTIAL/PLATELET
Basophils Absolute: 0 K/uL (ref 0.0–0.1)
Basophils Relative: 0.5 % (ref 0.0–3.0)
Eosinophils Absolute: 0.2 K/uL (ref 0.0–0.7)
Eosinophils Relative: 3.3 % (ref 0.0–5.0)
HCT: 40 % (ref 36.0–46.0)
Hemoglobin: 12.8 g/dL (ref 12.0–15.0)
Lymphocytes Relative: 41.7 % (ref 12.0–46.0)
Lymphs Abs: 2.4 K/uL (ref 0.7–4.0)
MCHC: 32.1 g/dL (ref 30.0–36.0)
MCV: 80.2 fl (ref 78.0–100.0)
Monocytes Absolute: 0.5 K/uL (ref 0.1–1.0)
Monocytes Relative: 8.4 % (ref 3.0–12.0)
Neutro Abs: 2.7 K/uL (ref 1.4–7.7)
Neutrophils Relative %: 46.1 % (ref 43.0–77.0)
Platelets: 286 K/uL (ref 150.0–400.0)
RBC: 4.99 Mil/uL (ref 3.87–5.11)
RDW: 15.9 % — ABNORMAL HIGH (ref 11.5–15.5)
WBC: 5.8 K/uL (ref 4.0–10.5)

## 2024-02-06 LAB — BASIC METABOLIC PANEL WITH GFR
BUN: 13 mg/dL (ref 6–23)
CO2: 28 meq/L (ref 19–32)
Calcium: 9.9 mg/dL (ref 8.4–10.5)
Chloride: 100 meq/L (ref 96–112)
Creatinine, Ser: 0.79 mg/dL (ref 0.40–1.20)
GFR: 84.41 mL/min (ref >60.00)
Glucose, Bld: 112 mg/dL — ABNORMAL HIGH (ref 70–99)
Potassium: 3.6 meq/L (ref 3.5–5.1)
Sodium: 139 meq/L (ref 135–145)

## 2024-02-06 LAB — LIPID PANEL
Cholesterol: 226 mg/dL — ABNORMAL HIGH (ref 0–200)
HDL: 51.8 mg/dL (ref >39.00)
LDL Cholesterol: 141 mg/dL — ABNORMAL HIGH (ref 0–99)
NonHDL: 174.2
Total CHOL/HDL Ratio: 4
Triglycerides: 164 mg/dL — ABNORMAL HIGH (ref 0.0–149.0)
VLDL: 32.8 mg/dL (ref 0.0–40.0)

## 2024-02-06 LAB — HEPATIC FUNCTION PANEL
ALT: 20 U/L (ref 0–35)
AST: 19 U/L (ref 0–37)
Albumin: 4.8 g/dL (ref 3.5–5.2)
Alkaline Phosphatase: 91 U/L (ref 39–117)
Bilirubin, Direct: 0.1 mg/dL (ref 0.0–0.3)
Total Bilirubin: 0.4 mg/dL (ref 0.2–1.2)
Total Protein: 8.3 g/dL (ref 6.0–8.3)

## 2024-02-06 LAB — TSH: TSH: 1.79 u[IU]/mL (ref 0.35–5.50)

## 2024-02-06 LAB — HEMOGLOBIN A1C: Hgb A1c MFr Bld: 6.4 % (ref 4.6–6.5)

## 2024-02-06 MED ORDER — FUROSEMIDE 20 MG PO TABS: 20.0000 mg | ORAL_TABLET | Freq: Every day | ORAL | 3 refills | Status: AC | PRN

## 2024-02-06 MED ORDER — LORAZEPAM 0.5 MG PO TABS: 0.5000 mg | ORAL_TABLET | Freq: Three times a day (TID) | ORAL | 5 refills | Status: AC | PRN

## 2024-02-06 MED ORDER — RIZATRIPTAN BENZOATE 10 MG PO TABS: ORAL_TABLET | ORAL | 11 refills | Status: AC

## 2024-02-06 MED ORDER — MELOXICAM 15 MG PO TABS: 15.0000 mg | ORAL_TABLET | Freq: Every day | ORAL | 3 refills | Status: AC

## 2024-02-06 NOTE — Progress Notes (Signed)
 Subjective:    Patient ID: Beverly Rogers, female    DOB: 1971/07/01, 53 y.o.   MRN: [id number]  HPI Here for a well exam. She has a few issues to discuss. First she has had some intermittent pain in the lower back for a few months. No hx of trauma. She is participating in a PT program through her job which has helped. She takes Advil but this does not last very long. Otherwise she is working with a nutritionist though her work to lose weight. They are working to get approval for her to use a GLP-1 medication.    Review of Systems  Constitutional: Negative.   HENT: Negative.    Eyes: Negative.   Respiratory: Negative.    Cardiovascular: Negative.   Gastrointestinal: Negative.   Genitourinary:  Negative for decreased urine volume, difficulty urinating, dyspareunia, dysuria, enuresis, flank pain, frequency, hematuria, pelvic pain and urgency.  Musculoskeletal:  Positive for back pain.  Skin: Negative.   Neurological: Negative.  Negative for headaches.  Psychiatric/Behavioral: Negative.         Objective:   Physical Exam Constitutional:      General: She is not in acute distress.    Appearance: She is well-developed. She is obese.  HENT:     Head: Normocephalic and atraumatic.     Right Ear: External ear normal.     Left Ear: External ear normal.     Nose: Nose normal.     Mouth/Throat:     Pharynx: No oropharyngeal exudate.  Eyes:     General: No scleral icterus.    Conjunctiva/sclera: Conjunctivae normal.     Pupils: Pupils are equal, round, and reactive to light.  Neck:     Thyroid : No thyromegaly.     Vascular: No JVD.  Cardiovascular:     Rate and Rhythm: Normal rate and regular rhythm.     Pulses: Normal pulses.     Heart sounds: Normal heart sounds. No murmur heard.    No friction rub. No gallop.  Pulmonary:     Effort: Pulmonary effort is normal. No respiratory distress.     Breath sounds: Normal breath sounds. No wheezing or rales.  Chest:     Chest wall:  No tenderness.  Abdominal:     General: Bowel sounds are normal. There is no distension.     Palpations: Abdomen is soft. There is no mass.     Tenderness: There is no abdominal tenderness. There is no guarding or rebound.  Musculoskeletal:        General: No tenderness. Normal range of motion.     Cervical back: Normal range of motion and neck supple.  Lymphadenopathy:     Cervical: No cervical adenopathy.  Skin:    General: Skin is warm and dry.     Findings: No erythema or rash.  Neurological:     General: No focal deficit present.     Mental Status: She is alert and oriented to person, place, and time.     Cranial Nerves: No cranial nerve deficit.     Motor: No abnormal muscle tone.     Coordination: Coordination normal.     Deep Tendon Reflexes: Reflexes are normal and symmetric. Reflexes normal.  Psychiatric:        Mood and Affect: Mood normal.        Behavior: Behavior normal.        Thought Content: Thought content normal.        Judgment: Judgment  normal.           Assessment & Plan:  Well exam. We discussed diet and exercise. Get fasting labs. For the back pain, she will try Meloxicam  15 mg daily.  Garnette Olmsted, MD

## 2024-02-07 ENCOUNTER — Ambulatory Visit: Payer: Self-pay | Admitting: Family Medicine

## 2024-02-08 ENCOUNTER — Encounter: Payer: Self-pay | Admitting: Family Medicine

## 2024-02-12 NOTE — Telephone Encounter (Signed)
 Actually an A1c of 6.4% shows prediabetes (6.5 % or above would indicate diabetes). We treat this with diet and exercise. Let's repeat an A1c in 90 days

## 2024-04-08 ENCOUNTER — Ambulatory Visit: Admitting: Family Medicine

## 2024-04-08 ENCOUNTER — Encounter: Payer: Self-pay | Admitting: Family Medicine

## 2024-04-08 VITALS — BP 132/84 | HR 91 | Temp 98.1°F | Wt 225.0 lb

## 2024-04-08 DIAGNOSIS — M545 Low back pain, unspecified: Secondary | ICD-10-CM | POA: Diagnosis not present

## 2024-04-08 DIAGNOSIS — G8929 Other chronic pain: Secondary | ICD-10-CM | POA: Diagnosis not present

## 2024-04-08 NOTE — Progress Notes (Signed)
   Subjective:    Patient ID: Beverly Rogers, female    DOB: 05-22-1971, 53 y.o.   MRN: [id number]  HPI Here to follow up on low back pain. She is taking Meloxicam , Flexeril , and Tylenol  with little relief. She still has severe pain every day. She had a plain Xray of the lumbar spine in 2022 showing facet arthropathy.    Review of Systems  Constitutional: Negative.   Respiratory: Negative.    Cardiovascular: Negative.   Musculoskeletal:  Positive for back pain.       Objective:   Physical Exam Constitutional:      Appearance: She is obese.     Comments: In some pain   Cardiovascular:     Rate and Rhythm: Normal rate and regular rhythm.     Pulses: Normal pulses.     Heart sounds: Normal heart sounds.  Pulmonary:     Effort: Pulmonary effort is normal.     Breath sounds: Normal breath sounds.  Musculoskeletal:     Comments: She is very tender in the lower back with decreased ROM  Neurological:     Mental Status: She is alert.           Assessment & Plan:  Low back pain. We will set up an MRI of the lumbar spine.  Garnette Olmsted, MD

## 2024-04-14 ENCOUNTER — Encounter: Payer: Self-pay | Admitting: Family Medicine

## 2024-04-21 ENCOUNTER — Ambulatory Visit
Admission: RE | Admit: 2024-04-21 | Discharge: 2024-04-21 | Disposition: A | Source: Ambulatory Visit | Attending: Family Medicine | Admitting: Family Medicine

## 2024-04-21 ENCOUNTER — Other Ambulatory Visit

## 2024-04-21 ENCOUNTER — Ambulatory Visit: Payer: Self-pay | Admitting: Family Medicine

## 2024-04-21 DIAGNOSIS — M545 Low back pain, unspecified: Secondary | ICD-10-CM

## 2024-06-04 ENCOUNTER — Ambulatory Visit (INDEPENDENT_AMBULATORY_CARE_PROVIDER_SITE_OTHER)

## 2024-06-04 DIAGNOSIS — Z23 Encounter for immunization: Secondary | ICD-10-CM

## 2024-07-28 ENCOUNTER — Emergency Department (HOSPITAL_BASED_OUTPATIENT_CLINIC_OR_DEPARTMENT_OTHER)
Admission: EM | Admit: 2024-07-28 | Discharge: 2024-07-28 | Disposition: A | Discharge status: Home / Self Care | Attending: Emergency Medicine | Admitting: Emergency Medicine

## 2024-07-28 ENCOUNTER — Other Ambulatory Visit: Payer: Self-pay

## 2024-07-28 ENCOUNTER — Encounter (HOSPITAL_BASED_OUTPATIENT_CLINIC_OR_DEPARTMENT_OTHER): Payer: Self-pay | Admitting: *Deleted

## 2024-07-28 ENCOUNTER — Emergency Department (HOSPITAL_BASED_OUTPATIENT_CLINIC_OR_DEPARTMENT_OTHER)

## 2024-07-28 ENCOUNTER — Ambulatory Visit: Payer: Self-pay

## 2024-07-28 DIAGNOSIS — I10 Essential (primary) hypertension: Secondary | ICD-10-CM | POA: Insufficient documentation

## 2024-07-28 DIAGNOSIS — Z79899 Other long term (current) drug therapy: Secondary | ICD-10-CM | POA: Insufficient documentation

## 2024-07-28 DIAGNOSIS — R7989 Other specified abnormal findings of blood chemistry: Secondary | ICD-10-CM | POA: Diagnosis not present

## 2024-07-28 LAB — CBC WITH DIFFERENTIAL/PLATELET
Abs Immature Granulocytes: 0.02 K/uL (ref 0.00–0.07)
Basophils Absolute: 0 K/uL (ref 0.0–0.1)
Basophils Relative: 1 %
Eosinophils Absolute: 0.2 K/uL (ref 0.0–0.5)
Eosinophils Relative: 2 %
HCT: 39.4 % (ref 36.0–46.0)
Hemoglobin: 12.7 g/dL (ref 12.0–15.0)
Immature Granulocytes: 0 %
Lymphocytes Relative: 37 %
Lymphs Abs: 3.1 K/uL (ref 0.7–4.0)
MCH: 26 pg (ref 26.0–34.0)
MCHC: 32.2 g/dL (ref 30.0–36.0)
MCV: 80.6 fL (ref 80.0–100.0)
Monocytes Absolute: 0.7 K/uL (ref 0.1–1.0)
Monocytes Relative: 9 %
Neutro Abs: 4.4 K/uL (ref 1.7–7.7)
Neutrophils Relative %: 51 %
Platelets: 291 K/uL (ref 150–400)
RBC: 4.89 MIL/uL (ref 3.87–5.11)
RDW: 16.6 % — ABNORMAL HIGH (ref 11.5–15.5)
WBC: 8.5 K/uL (ref 4.0–10.5)
nRBC: 0 % (ref 0.0–0.2)

## 2024-07-28 LAB — BASIC METABOLIC PANEL WITH GFR
Anion gap: 14 (ref 5–15)
BUN: 12 mg/dL (ref 6–20)
CO2: 24 mmol/L (ref 22–32)
Calcium: 10.2 mg/dL (ref 8.9–10.3)
Chloride: 102 mmol/L (ref 98–111)
Creatinine, Ser: 0.85 mg/dL (ref 0.44–1.00)
GFR, Estimated: >60 mL/min
Glucose, Bld: 89 mg/dL (ref 70–99)
Potassium: 3.5 mmol/L (ref 3.5–5.1)
Sodium: 140 mmol/L (ref 135–145)

## 2024-07-28 LAB — TROPONIN T, HIGH SENSITIVITY: Troponin T High Sensitivity: <15 ng/L (ref 0–19)

## 2024-07-28 LAB — D-DIMER, QUANTITATIVE: D-Dimer, Quant: 0.98 ug{FEU}/mL — ABNORMAL HIGH (ref 0.00–0.50)

## 2024-07-28 MED ORDER — IOHEXOL 350 MG/ML SOLN
75.0000 mL | Freq: Once | INTRAVENOUS | Status: AC | PRN
  Administered 2024-07-28: 75 mL via INTRAVENOUS

## 2024-07-28 MED ORDER — AMLODIPINE BESYLATE 5 MG PO TABS
5.0000 mg | ORAL_TABLET | Freq: Once | ORAL | Status: AC
  Administered 2024-07-28: 5 mg via ORAL
  Filled 2024-07-28: qty 1

## 2024-07-28 MED ORDER — AMLODIPINE BESYLATE 5 MG PO TABS: 5.0000 mg | ORAL_TABLET | Freq: Every day | ORAL | 0 refills | Status: AC

## 2024-07-28 NOTE — Telephone Encounter (Signed)
 FYI Pt is currently at the ED for this problem

## 2024-07-28 NOTE — ED Triage Notes (Signed)
 Pt went to donate blood and after the blood donation she was hypertensive 188/128 and 184/124.  Pt is not on any BP meds.  She felt anxious about her BP and called her PCP who advised her to come in to be seen here in the ED>  Pt took a lorazepam  pta, BP wnl.

## 2024-07-28 NOTE — Telephone Encounter (Signed)
 FYI Only or Action Required?: FYI only for provider: ED advised.  Patient was last seen in primary care on 04/08/2024 by Johnny Garnette LABOR, MD.  Called Nurse Triage reporting No chief complaint on file..  Symptoms began today.  Interventions attempted: Nothing.  Symptoms are: stable.  Triage Disposition: Go to ED Now (Notify PCP)  Patient/caregiver understands and will follow disposition?:   Reason for Triage: Pt called in stating that her blood pressure has elevated. Pt stated at 1pm it was 188/128 and at 1:16pm it was 184/124. Pt thinks the medication buPROPion  ER (WELLBUTRIN  SR) 100 MG 12 hr tablet is the issue, pt mentioned she didn't have any issues until she started taking it. Pt denied having any other symptoms at this time. Warm transferred to nurse triage.   Reason for Disposition  [1] Systolic BP >= 160 OR Diastolic >= 100 AND [2] cardiac (e.g., breathing difficulty, chest pain) or neurologic symptoms (e.g., new-onset blurred or double vision, unsteady gait)  Answer Assessment - Initial Assessment Questions Triage stopped and referred to ED.    1. BLOOD PRESSURE: What is your blood pressure? Did you take at least two measurements 5 minutes apart?     Yes, pt was at blood center to give blood. BP last check 184/124 2. ONSET: When did you take your blood pressure?      3. HOW: How did you take your blood pressure? (e.g., automatic home BP monitor, visiting nurse)      4. HISTORY: Do you have a history of high blood pressure?     5. MEDICINES: Are you taking any medicines for blood pressure? Have you missed any doses recently?      6. OTHER SYMPTOMS: Do you have any symptoms? (e.g., blurred vision, chest pain, difficulty breathing, headache, weakness)     sob 7. PREGNANCY: Is there any chance you are pregnant? When was your last menstrual period?  Protocols used: Blood Pressure - High-A-AH

## 2024-07-28 NOTE — Discharge Instructions (Addendum)
 Imaging and lab work overall reassuring today.  I have given you a prescription for blood pressure medication.  I recommend taking it once daily until follow-up with your primary care.  It is recommended to follow-up with primary care as soon as possible to discuss blood pressure treatment.  If you experience any concerning new or worsening symptoms please return the emergency department for further evaluation.  Symptoms would include dizziness, visual disturbances, chest pain, shortness of breath, fainting.  If any of these or other concerning symptoms arise please return for further evaluation.

## 2024-07-28 NOTE — ED Provider Notes (Signed)
 " [place] EMERGENCY DEPARTMENT AT [facility] Provider Note   CSN: [id number] Arrival date & time: 07/28/24  1403     Patient presents with: Hypertension   Beverly Rogers is a 54 y.o. female.  54 year old female presents emergency department with complaints of hypertension.  Patient reports she went to donate blood today and her blood pressure was elevated at 188/128 and then shortly after they rechecked it and it was 184/124 and they advised that she cannot get blood today.  She did not donate blood after this.  She reports she called her PCP and they advised her to go to the emergency department for further evaluation.  Patient took an lorazepam  prior to arrival. Patient denies any chest pain, shortness of breath, dizziness, syncope, headache currently.  Patient reports she was started on Wellbutrin  naltrexone and metformin for weight loss and has had a few episodes of hypertension since then.  She reports she was at the dentist and had an elevated blood pressure and at that time her Wellbutrin  was decreased.  She also endorses some associated shortness of breath mainly on short movements including going to the kitchen, going up stairs and going to her car.  She reports she feels like her heart rate increases in has a difficulty time catching her breath for a few seconds.  Patient reports she is on estradiol  denies any recent surgeries, smoking, recent illnesses, recent travels.  She reports her shortness of breath on exertion has been getting worse since approximately November.     Prior to Admission medications  Medication Sig Start Date End Date Taking? Authorizing Provider  amLODipine  (NORVASC ) 5 MG tablet Take 1 tablet (5 mg total) by mouth daily. 07/28/24  Yes Myriam Fonda RAMAN, PA-C  albuterol  (VENTOLIN  HFA) 108 (90 Base) MCG/ACT inhaler INHALE 2 PUFFS BY MOUTH EVERY 4 HOURS AS NEEDED FOR WHEEZE OR FOR SHORTNESS OF BREATH 01/15/23   Johnny Garnette LABOR, MD  buPROPion  ER (WELLBUTRIN   SR) 100 MG 12 hr tablet Take 100 mg by mouth every morning. 04/07/24   [provider]  cyclobenzaprine  (FLEXERIL ) 10 MG tablet Take 1 tablet (10 mg total) by mouth 3 (three) times daily as needed for muscle spasms. Patient not taking: Reported on 04/08/2024 11/06/23   Johnny Garnette LABOR, MD  estradiol  (CLIMARA  - DOSED IN MG/24 HR) 0.025 mg/24hr patch Place 1 patch (0.025 mg total) onto the skin 2 (two) times a week. 11/05/17   Johnny Garnette LABOR, MD  famotidine  (PEPCID ) 40 MG tablet Take 1 tablet (40 mg total) by mouth at bedtime. 09/08/20   Johnny Garnette LABOR, MD  ferrous sulfate  325 (65 FE) MG tablet TAKE 325 MG BY MOUTH DAILY. 07/28/22   Johnny Garnette LABOR, MD  FLUoxetine  (PROZAC ) 40 MG capsule Take 1 capsule (40 mg total) by mouth 2 (two) times daily. 12/20/22   Johnny Garnette LABOR, MD  furosemide  (LASIX ) 20 MG tablet Take 1 tablet (20 mg total) by mouth daily as needed for fluid or edema. 02/06/24   Johnny Garnette LABOR, MD  halobetasol  (ULTRAVATE ) 0.05 % cream Apply topically 2 (two) times daily as needed. For eczema 11/06/16   Johnny Garnette LABOR, MD  ketoconazole  (NIZORAL ) 2 % cream Apply 1 application topically 2 (two) times daily as needed for irritation (yeast infection). 11/06/16   Johnny Garnette LABOR, MD  LORazepam  (ATIVAN ) 0.5 MG tablet Take 1 tablet (0.5 mg total) by mouth every 8 (eight) hours as needed for anxiety. for anxiety 02/06/24   Johnny,  Garnette LABOR, MD  meloxicam  (MOBIC ) 15 MG tablet Take 1 tablet (15 mg total) by mouth daily. 02/06/24   Johnny Garnette LABOR, MD  naltrexone (DEPADE) 50 MG tablet Take by mouth daily. Patient taking differently: Take by mouth daily. Take 1/2 tablet by mouth daily    [provider]  omeprazole  (PRILOSEC) 40 MG capsule TAKE 1 CAPSULE BY MOUTH EVERY DAY 03/24/22   Armbruster, Elspeth SQUIBB, MD  promethazine  (PHENERGAN ) 25 MG tablet Take 1 tablet (25 mg total) by mouth every 4 (four) hours as needed for nausea. 11/06/23   Johnny Garnette LABOR, MD  rizatriptan  (MAXALT ) 10 MG tablet TAKE 1 TABLET BY  MOUTH ONCE AS NEEDED. MAY REPEAT IN 2 HOURS IF NEEDED 02/06/24   Johnny Garnette LABOR, MD  solifenacin  (VESICARE ) 5 MG tablet Take 1 tablet (5 mg total) by mouth daily. 12/20/22   Johnny Garnette LABOR, MD  valACYclovir (VALTREX) 500 MG tablet Take 1 tablet by mouth as needed. 01/21/20   [provider]  zolpidem (AMBIEN CR) 6.25 MG CR tablet Take 6.25 mg by mouth at bedtime as needed for sleep.    [provider]    Allergies: Bactrim  [sulfamethoxazole -trimethoprim ], Ciprofloxacin, and Lisinopril     Review of Systems  Respiratory:  Positive for shortness of breath.   All other systems reviewed and are negative.   Updated Vital Signs BP (!) 191/104 (BP Location: Left Arm)   Pulse 92   Temp 99 F (37.2 C) (Oral)   Resp 14   LMP 09/29/2010   SpO2 99%   Physical Exam Vitals and nursing note reviewed.  Constitutional:      General: She is not in acute distress.    Appearance: Normal appearance. She is not ill-appearing.  HENT:     Head: Normocephalic and atraumatic.     Nose: Nose normal.  Eyes:     Extraocular Movements: Extraocular movements intact.     Conjunctiva/sclera: Conjunctivae normal.     Pupils: Pupils are equal, round, and reactive to light.  Cardiovascular:     Rate and Rhythm: Normal rate and regular rhythm.     Heart sounds: Normal heart sounds.  Pulmonary:     Effort: Pulmonary effort is normal. No respiratory distress.     Breath sounds: Normal breath sounds.  Abdominal:     General: Abdomen is flat.     Tenderness: There is no guarding.  Musculoskeletal:        General: Normal range of motion.     Cervical back: Normal range of motion.  Skin:    General: Skin is warm.     Capillary Refill: Capillary refill takes less than 2 seconds.  Neurological:     General: No focal deficit present.     Mental Status: She is alert.  Psychiatric:        Mood and Affect: Mood normal.        Behavior: Behavior normal.     (all labs ordered are listed, but  only abnormal results are displayed) Labs Reviewed  CBC WITH DIFFERENTIAL/PLATELET - Abnormal; Notable for the following components:      Result Value   RDW 16.6 (*)    All other components within normal limits  D-DIMER, QUANTITATIVE - Abnormal; Notable for the following components:   D-Dimer, Quant 0.98 (*)    All other components within normal limits  BASIC METABOLIC PANEL WITH GFR  TROPONIN T, HIGH SENSITIVITY    EKG: EKG Interpretation Date/Time:  Monday July 28 2024  17:21:54 EST Ventricular Rate:  81 PR Interval:  198 QRS Duration:  92 QT Interval:  392 QTC Calculation: 455 R Axis:   4  Text Interpretation: Sinus rhythm LAE, consider biatrial enlargement Left ventricular hypertrophy No significant change since last tracing Confirmed by Randol Simmonds [phone]) on 07/28/2024 5:32:38 PM  Radiology: CT Angio Chest PE W and/or Wo Contrast Result Date: 07/28/2024 CLINICAL DATA:  Hypertensive episode. EXAM: CT ANGIOGRAPHY CHEST WITH CONTRAST TECHNIQUE: Multidetector CT imaging of the chest was performed using the standard protocol during bolus administration of intravenous contrast. Multiplanar CT image reconstructions and MIPs were obtained to evaluate the vascular anatomy. RADIATION DOSE REDUCTION: This exam was performed according to the departmental dose-optimization program which includes automated exposure control, adjustment of the mA and/or kV according to patient size and/or use of iterative reconstruction technique. CONTRAST:  75mL OMNIPAQUE  IOHEXOL  350 MG/ML SOLN COMPARISON:  None Available. FINDINGS: Cardiovascular: Satisfactory opacification of the pulmonary arteries to the segmental level. No evidence of pulmonary embolism. Normal heart size. No pericardial effusion. Mediastinum/Nodes: No enlarged mediastinal, hilar, or axillary lymph nodes. Thyroid  gland, trachea, and esophagus demonstrate no significant findings. Lungs/Pleura: Lungs are clear. No pleural effusion or  pneumothorax. Upper Abdomen: No acute abnormality. Musculoskeletal: No chest wall abnormality. No acute or significant osseous findings. Review of the MIP images confirms the above findings. IMPRESSION: No evidence of pulmonary embolism or acute cardiopulmonary disease. Electronically Signed   By: Suzen Dials M.D.   On: 07/28/2024 19:16     Procedures   Medications Ordered in the ED  iohexol  (OMNIPAQUE ) 350 MG/ML injection 75 mL (75 mLs Intravenous Contrast Given 07/28/24 1903)  amLODipine  (NORVASC ) tablet 5 mg (5 mg Oral Given 07/28/24 1957)    54 y.o. female presents to the ED with complaints of hypertension and intermittent exertional shortness of breath, The differential diagnosis includes but not limited to hypertension, anxiety, PE, ACS,  (Ddx)  On arrival pt is nontoxic, vitals significant for tachycardia on initial evaluation normal BP. Exam unremarkable   I ordered medication Norvasc  for hypertension  Lab Tests:  CBC BMP D-dimer troponin D-dimer elevated at 0.98 all other lab work unremarkable.  Imaging Studies ordered:  I ordered imaging studies which included CT angio ordered due to elevated D-dimer, negative for pulmonary embolism or other chest etiology.  ED Course:   Patient is sitting comfortably in ED bed in no acute distress nontoxic-appearing on initial evaluation.  BP is unremarkable and patient has no current complaints.  Due to patient being on estradiol  and complaints of exertional shortness of breath D-dimer will be ordered at this time.  EKG is nonischemic.  D-dimer was elevated at 0.98 will obtain CTA PE rule out at this time.  No unilateral leg swelling noted low concern for DVT.  CTA was negative.  Troponin was negative.  Patient will be started on amlodipine  and given prescription.  Patient was advised to follow-up with PCP for further recommendation of hypertension.  Patient has elevated blood pressure at discharge but otherwise asymptomatic and stable.   Patient was given strict return precautions and was comfortable discharge at this time.  Portions of this note were generated with Scientist, clinical (histocompatibility and immunogenetics). Dictation errors may occur despite best attempts at proofreading.   Final diagnoses:  Hypertension, unspecified type    ED Discharge Orders          Ordered    amLODipine  (NORVASC ) 5 MG tablet  Daily        07/28/24 2035  Myriam Fonda RAMAN, [place] 07/29/24 9095    Randol Simmonds, MD 07/29/24 1332  "

## 2024-07-30 ENCOUNTER — Encounter: Payer: Self-pay | Admitting: Family Medicine

## 2024-07-30 ENCOUNTER — Ambulatory Visit: Admitting: Family Medicine

## 2024-07-30 VITALS — BP 178/120 | HR 75 | Temp 98.5°F | Wt 221.0 lb

## 2024-07-30 DIAGNOSIS — R030 Elevated blood-pressure reading, without diagnosis of hypertension: Secondary | ICD-10-CM

## 2024-07-30 LAB — POCT GLYCOSYLATED HEMOGLOBIN (HGB A1C): Hemoglobin A1C: 5.8 % — AB (ref 4.0–5.6)

## 2024-07-30 NOTE — Addendum Note (Signed)
 Addended by: LADONNA INOCENTE SAILOR on: 07/30/2024 02:58 PM   Modules accepted: Orders

## 2024-07-30 NOTE — Progress Notes (Signed)
" ° °  Subjective:    Patient ID: Beverly Rogers, female    DOB: 08-Dec-1970, 54 y.o.   MRN: [id number]  HPI Here to follow up on an ED visit on 07-28-24 for high BP. She has never had a problem with BP until lately. In October she began working with a provider employed through her workplace to help her lose weight. He started her on Bupropion , Metformin, and Naltrexone, and she has been taking this since then. She felt fine for awhile and no one was following her BP apparently. Then a week ago she began to have headaches and she felt SOB on exertion. No chest pain. At the ED her BP was 194/101. Her EKG showed sinus rhythm with LVH. All her labs were normal including a creatinine of 0.85. A CTA of the chest was normal. It was felt that the new medications were the cause of the high BP readings, so she was advised to stop taking the Bupropion , the Naltrexone, and the Metformin. She has stopped these with the last doses being taken 2 days ago. She was also given a RX for Amlodipine  5 mg to take daily, but she has not started this yet. She wants to get our opinion first.    Review of Systems  Constitutional:  Positive for fatigue.  Respiratory:  Positive for shortness of breath.   Cardiovascular: Negative.   Neurological:  Positive for headaches. Negative for dizziness.       Objective:   Physical Exam Constitutional:      Appearance: Normal appearance. She is ill-appearing.  Cardiovascular:     Rate and Rhythm: Normal rate and regular rhythm.     Pulses: Normal pulses.     Heart sounds: Normal heart sounds.  Pulmonary:     Effort: Pulmonary effort is normal.     Breath sounds: Normal breath sounds.  Musculoskeletal:     Right lower leg: No edema.     Left lower leg: No edema.  Neurological:     Mental Status: She is alert and oriented to person, place, and time. Mental status is at baseline.           Assessment & Plan:  She has been having high BP readings, and I agree that these  were probably caused by the 3 medications above. She has stopped these. I encouraged her to start taking the Amlodipine  daily, and she agreed. She will check her BP at home twice daily, and she will report back to us  in one week. I personally spent a total of 34 minutes in the care of the patient today including getting/reviewing separately obtained history, performing a medically appropriate exam/evaluation, counseling and educating, and independently interpreting results.  Garnette Olmsted, MD  Garnette Olmsted, MD   "

## 2024-08-08 ENCOUNTER — Encounter: Payer: Self-pay | Admitting: Family Medicine

## 2024-08-13 NOTE — Telephone Encounter (Signed)
 First, her A1c was 5.8% which is only slightly outside the normal range. We call this prediabetes, but this is quite mild she just needs to limit her intake of carbs and sugars. Second, Amlodipine  takes about 3 weeks to get completely in the system,so give it some more time
